# Patient Record
Sex: Female | Born: 1975 | Race: White | Hispanic: No | Marital: Single | Smoking: Former smoker
Health system: Southern US, Community
[De-identification: ages and names within clinical notes are randomized; demographics above are authoritative.]

## PROBLEM LIST (undated history)

## (undated) DIAGNOSIS — R7303 Prediabetes: Secondary | ICD-10-CM

## (undated) DIAGNOSIS — C801 Malignant (primary) neoplasm, unspecified: Secondary | ICD-10-CM

## (undated) DIAGNOSIS — E782 Mixed hyperlipidemia: Secondary | ICD-10-CM

## (undated) DIAGNOSIS — F988 Other specified behavioral and emotional disorders with onset usually occurring in childhood and adolescence: Secondary | ICD-10-CM

## (undated) DIAGNOSIS — Z803 Family history of malignant neoplasm of breast: Secondary | ICD-10-CM

## (undated) DIAGNOSIS — Z801 Family history of malignant neoplasm of trachea, bronchus and lung: Secondary | ICD-10-CM

## (undated) DIAGNOSIS — Z8049 Family history of malignant neoplasm of other genital organs: Secondary | ICD-10-CM

## (undated) DIAGNOSIS — I10 Essential (primary) hypertension: Secondary | ICD-10-CM

## (undated) HISTORY — PX: HAND SURGERY: SHX662

## (undated) HISTORY — DX: Family history of malignant neoplasm of breast: Z80.3

## (undated) HISTORY — DX: Family history of malignant neoplasm of other genital organs: Z80.49

## (undated) HISTORY — DX: Family history of malignant neoplasm of trachea, bronchus and lung: Z80.1

## (undated) HISTORY — PX: TUMOR REMOVAL: SHX12

## (undated) HISTORY — PX: BREAST SURGERY: SHX581

## (undated) HISTORY — PX: ENDOMETRIAL ABLATION: SHX621

---

## 2019-04-16 ENCOUNTER — Other Ambulatory Visit: Payer: Self-pay | Admitting: Physician Assistant

## 2019-04-16 ENCOUNTER — Ambulatory Visit
Admission: RE | Admit: 2019-04-16 | Discharge: 2019-04-16 | Disposition: A | Discharge status: Home / Self Care | Payer: PRIVATE HEALTH INSURANCE | Source: Ambulatory Visit | Attending: Physician Assistant | Admitting: Physician Assistant

## 2019-04-16 DIAGNOSIS — R2232 Localized swelling, mass and lump, left upper limb: Secondary | ICD-10-CM

## 2019-04-16 IMAGING — CR DG HAND 2V*L*
2 series · 2 of 2 positions shown · non-contrast
Comparison: None.

CLINICAL DATA: Hard nodule 5th finger left hand marked with BB, no
injury

EXAM:
LEFT HAND - 2 VIEW

[x hand pa left]
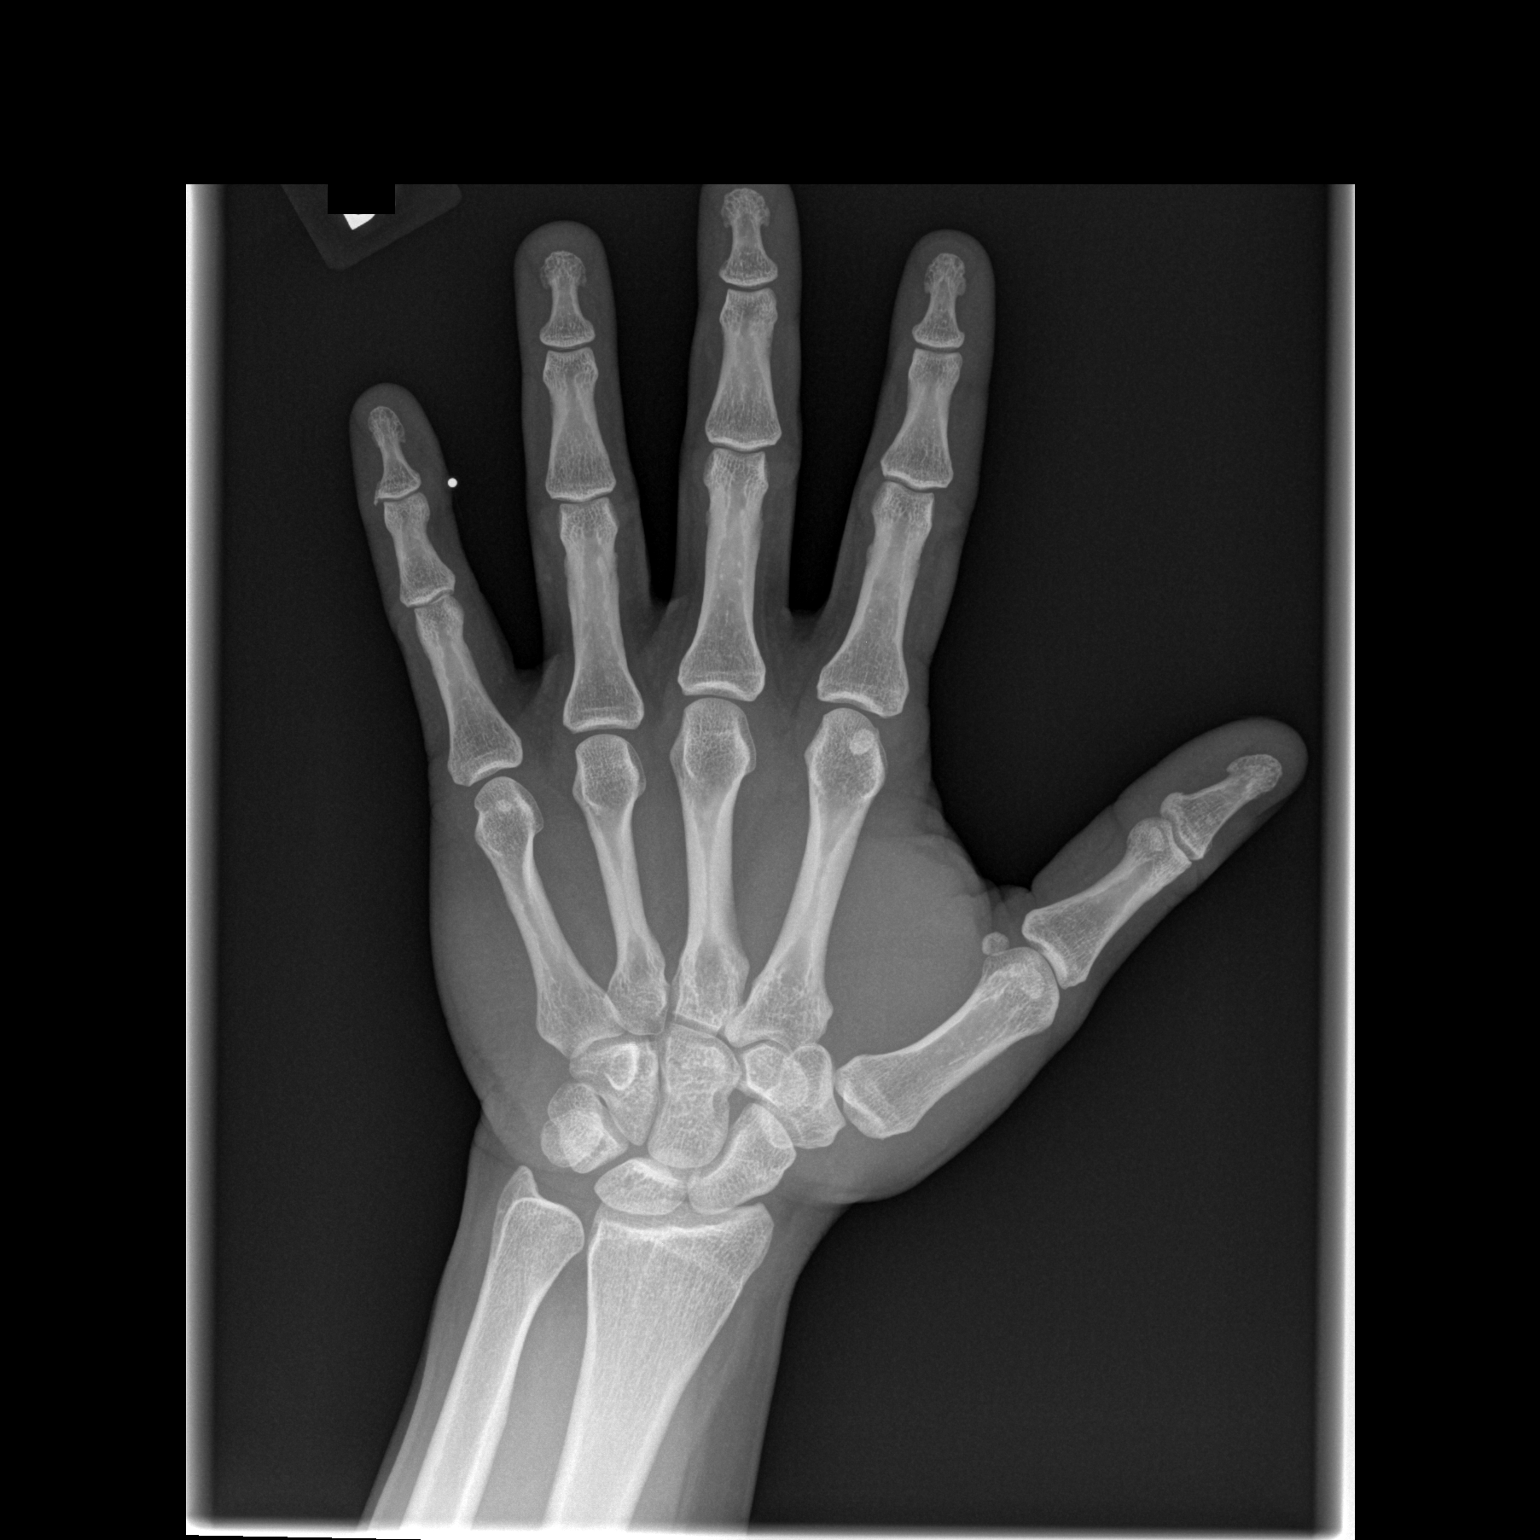

[x hand lat left]
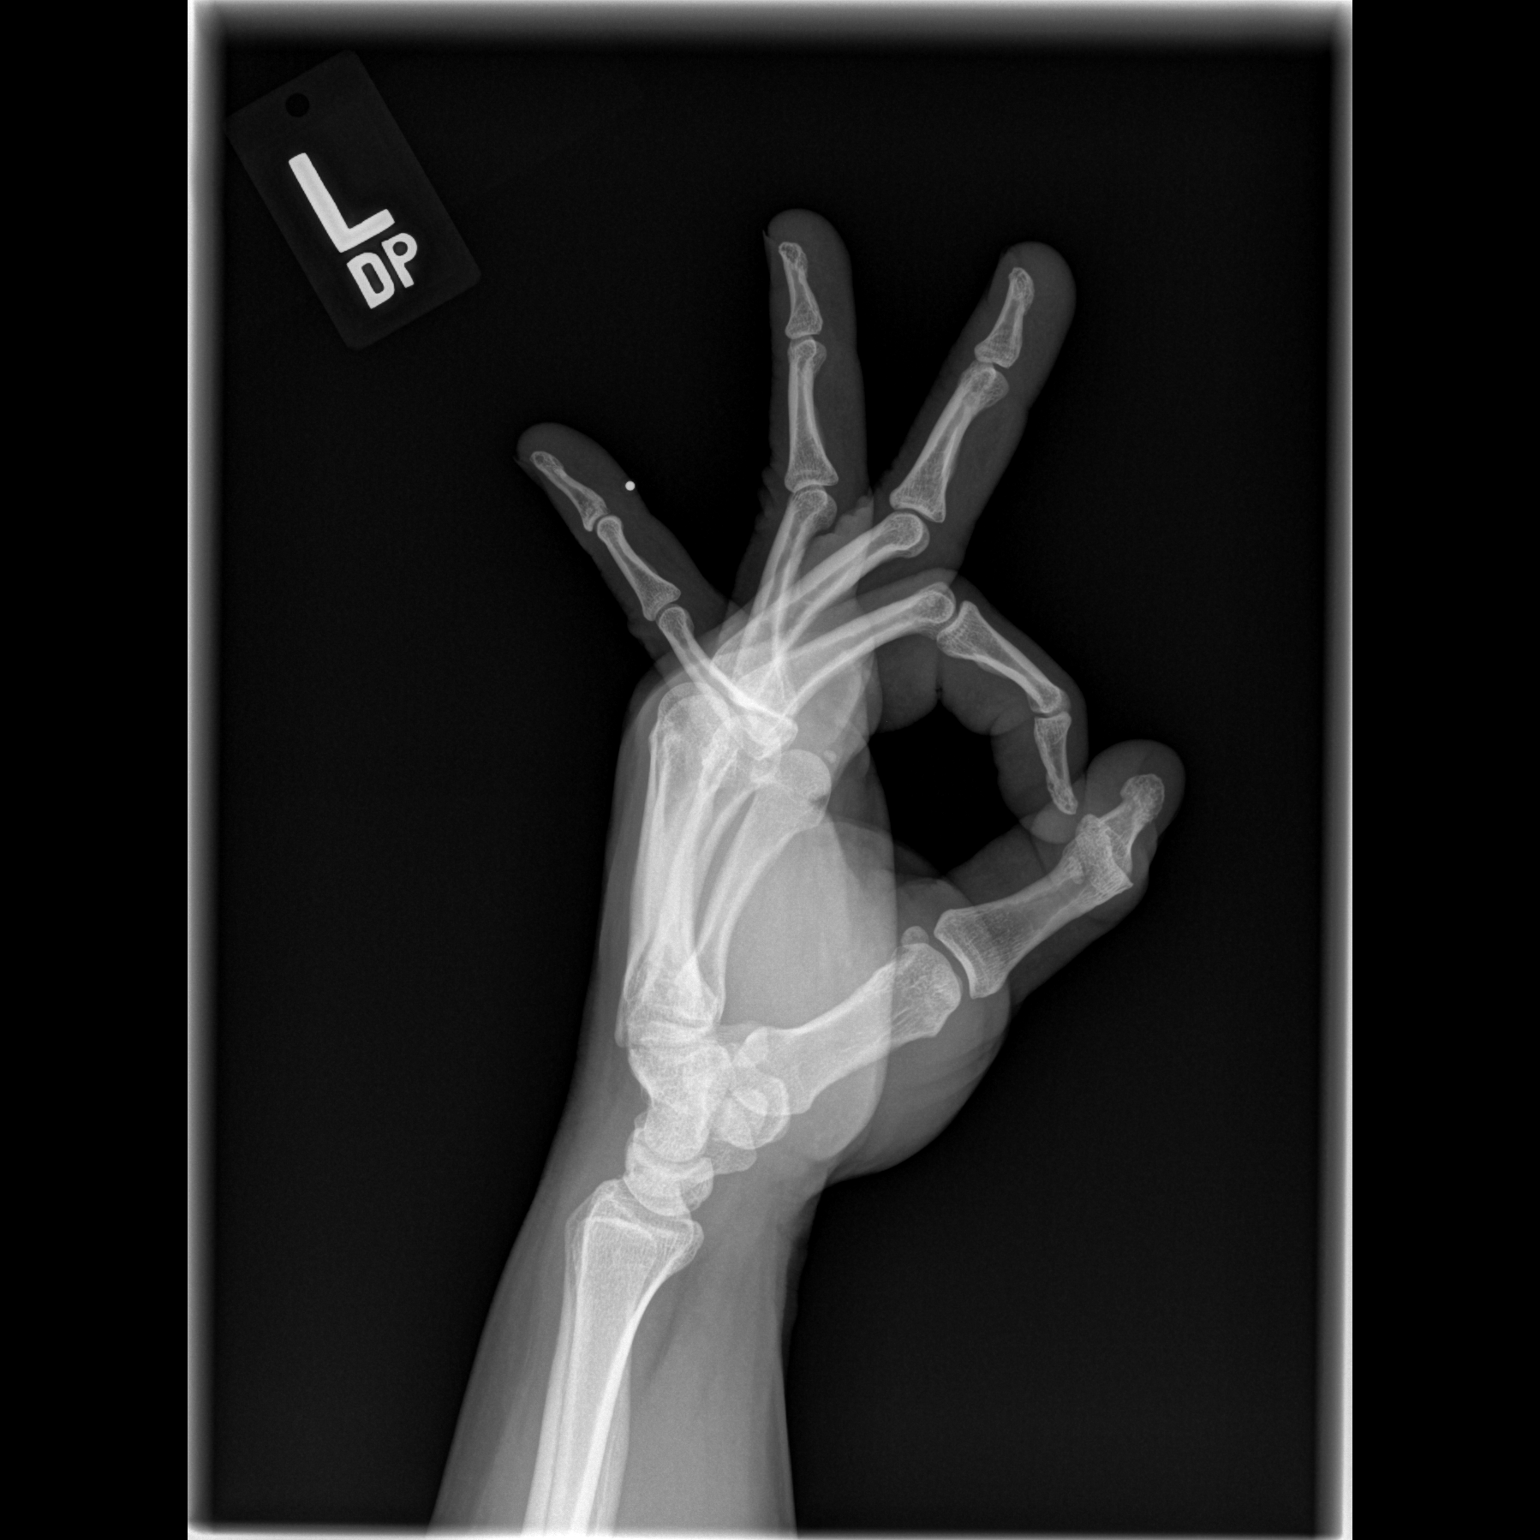

[2 of 2 positions shown; findings below may reference images not displayed]

FINDINGS: There is no evidence of fracture or dislocation. At the site of
concern in the left fifth finger distally marked by a BB there is no
focal osseous lesion. No significant degenerative changes. No
discrete soft tissue abnormality is noted.
IMPRESSION: No radiographic abnormality noted in the bone or soft tissues at the
site of concern in the distal left fifth finger.

## 2019-05-09 ENCOUNTER — Other Ambulatory Visit: Payer: Self-pay | Admitting: Radiology

## 2019-05-21 ENCOUNTER — Ambulatory Visit: Payer: Self-pay | Admitting: Surgery

## 2019-05-21 DIAGNOSIS — N6489 Other specified disorders of breast: Secondary | ICD-10-CM

## 2019-05-21 NOTE — H&P (View-Only) (Signed)
Christina Baldwin Documented: 05/21/2019 11:08 AM Location: [facility] Patient #: [id number] DOB: 1976/01/10 Single / Language: [name] / Race: White Female  History of Present Illness Christina Moores A. Khayman Kirsch MD; 05/21/2019 12:04 PM) Patient words: Patient sent at the request of Dr. Luan Pulling of [facility] center for abnormal right mammogram. This was her first mammogram. There are 2 areas in the right breast upper quadrant. Core biopsy showed radial scar AND PASH. She has 2 first-degree relatives with breast cancer on her mother's side. Patient denies any history of breast pain, nipple discharge or breast mass bilaterally.  The patient is a 43 year old female.   Past Surgical History Sharyn Lull R. Brooks, CMA; 05/21/2019 11:08 AM) Breast Biopsy Right.  Diagnostic Studies History Sharyn Lull R. Brooks, CMA; 05/21/2019 11:08 AM) Colonoscopy 5-10 years ago Mammogram within last year Pap Smear 1-5 years ago  Allergies Sharyn Lull R. Brooks, CMA; 05/21/2019 11:09 AM) Bactrim *ANTI-INFECTIVE AGENTS - MISC.*  Medication History Sharyn Lull R. Brooks, CMA; 05/21/2019 11:10 AM) Rosuvastatin Calcium (20MG  Tablet, Oral) Active. cloNIDine HCl (0.1MG  Tablet, Oral) Active. Fenofibrate (160MG  Tablet, Oral) Active. Lisinopril (10MG  Tablet, Oral) Active. SUMAtriptan Succinate (50MG  Tablet, Oral) Active. Methocarbamol (750MG  Tablet, Oral) Active. traZODone HCl (50MG  Tablet, Oral) Active. metFORMIN HCl (500MG  Tablet, Oral) Active. Adderall (20MG  Tablet, Oral) Active. Medications Reconciled  Social History Sharyn Lull R. Brooks, CMA; 05/21/2019 11:08 AM) Alcohol use Moderate alcohol use. Caffeine use Coffee, Tea. No drug use Tobacco use Former smoker.  Family History Sharyn Lull R. Rolena Infante, CMA; 05/21/2019 11:08 AM) Alcohol Abuse Brother, Father. Arthritis Brother, Family Members In General, Mother. Breast Cancer Family Members In General. Cancer Family Members In  General. Colon Polyps Family Members In General. Depression Family Members In General, Mother. Diabetes Mellitus Family Members In General. Heart Disease Family Members In General. Heart disease in female family member before age 40 Hypertension Brother, Father, Mother. Melanoma Family Members In General. Migraine Headache Brother, Mother, Son. Respiratory Condition Family Members In General. Seizure disorder Family Members In General. Thyroid problems Family Members In General.  Pregnancy / Birth History Sharyn Lull R. Rolena Infante, CMA; 05/21/2019 11:08 AM) Age at menarche 89 years. Gravida 1 Irregular periods Maternal age 67-25 Para 1  Other Problems Sharyn Lull R. Brooks, CMA; 05/21/2019 11:08 AM) Chronic Renal Failure Syndrome Gastric Ulcer Gastroesophageal Reflux Disease Hemorrhoids High blood pressure Hypercholesterolemia Lump In Breast Migraine Headache Seizure Disorder Transfusion history     Review of Systems [facility] R. Brooks CMA; 05/21/2019 11:08 AM) General Not Present- Appetite Loss, Chills, Fatigue, Fever, Night Sweats, Weight Gain and Weight Loss. Skin Not Present- Change in Wart/Mole, Dryness, Hives, Jaundice, New Lesions, Non-Healing Wounds, Rash and Ulcer. HEENT Present- Nose Bleed and Sinus Pain. Not Present- Earache, Hearing Loss, Hoarseness, Oral Ulcers, Ringing in the Ears, Seasonal Allergies, Sore Throat, Visual Disturbances, Wears glasses/contact lenses and Yellow Eyes. Respiratory Present- Snoring. Not Present- Bloody sputum, Chronic Cough, Difficulty Breathing and Wheezing. Breast Present- Breast Mass. Not Present- Breast Pain, Nipple Discharge and Skin Changes. Cardiovascular Not Present- Chest Pain, Difficulty Breathing Lying Down, Leg Cramps, Palpitations, Rapid Heart Rate, Shortness of Breath and Swelling of Extremities. Gastrointestinal Not Present- Abdominal Pain, Bloating, Bloody Stool, Change in Bowel Habits, Chronic  diarrhea, Constipation, Difficulty Swallowing, Excessive gas, Gets full quickly at meals, Hemorrhoids, Indigestion, Nausea, Rectal Pain and Vomiting. Female Genitourinary Not Present- Frequency, Nocturia, Painful Urination, Pelvic Pain and Urgency. Musculoskeletal Not Present- Back Pain, Joint Pain, Joint Stiffness, Muscle Pain, Muscle Weakness and Swelling of Extremities. Neurological Present- Headaches. Not Present- Decreased Memory, Fainting,  Numbness, Seizures, Tingling, Tremor, Trouble walking and Weakness. Psychiatric Not Present- Anxiety, Bipolar, Change in Sleep Pattern, Depression, Fearful and Frequent crying. Endocrine Not Present- Cold Intolerance, Excessive Hunger, Hair Changes, Heat Intolerance, Hot flashes and New Diabetes. Hematology Not Present- Blood Thinners, Easy Bruising, Excessive bleeding, Gland problems, HIV and Persistent Infections.  Vitals [employer] R. Brooks CMA; 05/21/2019 11:08 AM) 05/21/2019 11:08 AM Weight: 203.13 lb Height: 64in Body Surface Area: 1.97 m Body Mass Index: 34.87 kg/m  Pulse: 114 (Regular)  BP: 124/80 (Sitting, Left Arm, Standard)        Physical Exam (Aarron Wierzbicki A. Gisele Pack MD; 05/21/2019 12:05 PM)  General Mental Status-Alert. General Appearance-Consistent with stated age. Hydration-Well hydrated. Voice-Normal.  Chest and Lung Exam Note: no sob no wheezing  Breast Note: Mild right breast bruising noted. No Hematoma or mass. Left breast is normal.  Cardiovascular Note: NSR  Neurologic Neurologic evaluation reveals -alert and oriented x 3 with no impairment of recent or remote memory. Mental Status-Normal.  Musculoskeletal Normal Exam - Left-Upper Extremity Strength Normal and Lower Extremity Strength Normal. Normal Exam - Right-Upper Extremity Strength Normal and Lower Extremity Strength Normal.  Lymphatic Head & Neck  General Head & Neck Lymphatics: Bilateral - Description -  Normal. Axillary  General Axillary Region: Bilateral - Description - Normal. Tenderness - Non Tender.    Assessment & Plan (Enjoli Tidd A. 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 She desires excision of the second area since she has 2 clips in place and to remove it such that it does not show up on future mammograms. Discussed the pros and cons of this approach and potential cosmetic implications which I think will be minimal in her case. Discussed observation of the benign site as well but she would like it removed at the same setting. I discussed the use of 2 seeds as well. Risk of lumpectomy include bleeding, infection, seroma, more surgery, use of seed/wire, wound care, cosmetic deformity and the need for other treatments, death , blood clots, death. Pt agrees to proceed.  Current Plans Pt Education - [facility] Free Text Education/Instructions: discussed with patient and provided information. You are being scheduled for surgery- Our schedulers will call you.  You should hear from our office's scheduling department within 5 working days about the location, date, and time of surgery. We try to make accommodations for patient's preferences in scheduling surgery, but sometimes the OR schedule or the surgeon's schedule prevents [place] from making those accommodations.  If you have not heard from our office [phone] in 5 working days, call the office and ask for your surgeon's nurse.  If you have other questions about your diagnosis, plan, or surgery, call the office and ask for your surgeon's nurse.  Pt Education - [facility] Breast Biopsy HCI: discussed with patient and provided information.

## 2019-05-21 NOTE — H&P (Signed)
Christina Baldwin Documented: 05/21/2019 11:08 AM Location: [facility] Patient #: [id number] DOB: 1976-03-18 Single / Language: Christina Baldwin / Race: White Female  History of Present Illness Christina Moores A. Tisha Cline MD; 05/21/2019 12:04 PM) Patient words: Patient sent at the request of [facility]an [facility] for abnormal right mammogram. This was her first mammogram. There are 2 areas in the right breast upper quadrant. Core biopsy showed radial scar AND PASH. She has 2 first-degree relatives with breast cancer on her mother's side. Patient denies any history of breast pain, nipple discharge or breast mass bilaterally.  The patient is a 43 year old female.   Past Surgical History Christina Baldwin, Baldwin; 05/21/2019 11:08 AM) Breast Biopsy Right.  Diagnostic Studies History Christina Baldwin, Baldwin; 05/21/2019 11:08 AM) Colonoscopy 5-10 years ago Mammogram within last year Pap Smear 1-5 years ago  Allergies Christina Baldwin, Baldwin; 05/21/2019 11:09 AM) Bactrim *ANTI-INFECTIVE AGENTS - MISC.*  Medication History Christina Baldwin, Baldwin; 05/21/2019 11:10 AM) Rosuvastatin Calcium (20MG  Tablet, Oral) Active. cloNIDine HCl (0.1MG  Tablet, Oral) Active. Fenofibrate (160MG  Tablet, Oral) Active. Lisinopril (10MG  Tablet, Oral) Active. SUMAtriptan Succinate (50MG  Tablet, Oral) Active. Methocarbamol (750MG  Tablet, Oral) Active. traZODone HCl (50MG  Tablet, Oral) Active. metFORMIN HCl (500MG  Tablet, Oral) Active. Adderall (20MG  Tablet, Oral) Active. Medications Reconciled  Social History Christina Baldwin, Baldwin; 05/21/2019 11:08 AM) Alcohol use Moderate alcohol use. Caffeine use Coffee, Tea. No drug use Tobacco use Former smoker.  Family History Christina Baldwin, Baldwin; 05/21/2019 11:08 AM) Alcohol Abuse Brother, Father. Arthritis Brother, Family Members In General, Mother. Breast Cancer Family Members In General. Cancer Family Members In  General. Colon Polyps Family Members In General. Depression Family Members In General, Mother. Diabetes Mellitus Family Members In General. Heart Disease Family Members In General. Heart disease in female family member before age 50 Hypertension Brother, Father, Mother. Melanoma Family Members In General. Migraine Headache Brother, Mother, Son. Respiratory Condition Family Members In General. Seizure disorder Family Members In General. Thyroid problems Family Members In General.  Pregnancy / Birth History Christina Baldwin, Baldwin; 05/21/2019 11:08 AM) Age at menarche 57 years. Gravida 1 Irregular periods Maternal age 17-25 Para 1  Other Problems Christina Baldwin, Baldwin; 05/21/2019 11:08 AM) Chronic Renal Failure Syndrome Gastric Ulcer Gastroesophageal Reflux Disease Hemorrhoids High blood pressure Hypercholesterolemia Lump In Breast Migraine Headache Seizure Disorder Transfusion history     Review of Systems Christina Baldwin; 05/21/2019 11:08 AM) General Not Present- Appetite Loss, Chills, Fatigue, Fever, Night Sweats, Weight Gain and Weight Loss. Skin Not Present- Change in Wart/Mole, Dryness, Hives, Jaundice, New Lesions, Non-Healing Wounds, Rash and Ulcer. HEENT Present- Nose Bleed and Sinus Pain. Not Present- Earache, Hearing Loss, Hoarseness, Oral Ulcers, Ringing in the Ears, Seasonal Allergies, Sore Throat, Visual Disturbances, Wears glasses/contact lenses and Yellow Eyes. Respiratory Present- Snoring. Not Present- Bloody sputum, Chronic Cough, Difficulty Breathing and Wheezing. Breast Present- Breast Mass. Not Present- Breast Pain, Nipple Discharge and Skin Changes. Cardiovascular Not Present- Chest Pain, Difficulty Breathing Lying Down, Leg Cramps, Palpitations, Rapid Heart Rate, Shortness of Breath and Swelling of Extremities. Gastrointestinal Not Present- Abdominal Pain, Bloating, Bloody Stool, Change in Bowel Habits, Chronic  diarrhea, Constipation, Difficulty Swallowing, Excessive gas, Gets full quickly at meals, Hemorrhoids, Indigestion, Nausea, Rectal Pain and Vomiting. Female Genitourinary Not Present- Frequency, Nocturia, Painful Urination, Pelvic Pain and Urgency. Musculoskeletal Not Present- Back Pain, Joint Pain, Joint Stiffness, Muscle Pain, Muscle Weakness and Swelling of Extremities. Neurological Present- Headaches. Not Present- Decreased Memory, Fainting,  Numbness, Seizures, Tingling, Tremor, Trouble walking and Weakness. Psychiatric Not Present- Anxiety, Bipolar, Change in Sleep Pattern, Depression, Fearful and Frequent crying. Endocrine Not Present- Cold Intolerance, Excessive Hunger, Hair Changes, Heat Intolerance, Hot flashes and New Diabetes. Hematology Not Present- Blood Thinners, Easy Bruising, Excessive bleeding, Gland problems, HIV and Persistent Infections.  Vitals [employer] R. Baldwin Baldwin; 05/21/2019 11:08 AM) 05/21/2019 11:08 AM Weight: 203.13 lb Height: 64in Body Surface Area: 1.97 m Body Mass Index: 34.87 kg/m  Pulse: 114 (Regular)  BP: 124/80 (Sitting, Left Arm, Standard)        Physical Exam (Christina Riano A. Arushi Partridge MD; 05/21/2019 12:05 PM)  General Mental Status-Alert. General Appearance-Consistent with stated age. Hydration-Well hydrated. Voice-Normal.  Chest and Lung Exam Note: no sob no wheezing  Breast Note: Mild right breast bruising noted. No Hematoma or mass. Left breast is normal.  Cardiovascular Note: NSR  Neurologic Neurologic evaluation reveals -alert and oriented x 3 with no impairment of recent or remote memory. Mental Status-Normal.  Musculoskeletal Normal Exam - Left-Upper Extremity Strength Normal and Lower Extremity Strength Normal. Normal Exam - Right-Upper Extremity Strength Normal and Lower Extremity Strength Normal.  Lymphatic Head & Neck  General Head & Neck Lymphatics: Bilateral - Description -  Normal. Axillary  General Axillary Region: Bilateral - Description - Normal. Tenderness - Non Tender.    Assessment & Plan (Christina Mehan A. Miri Jose MD; 05/21/2019 12:06 PM)  RADIAL SCAR OF RIGHT BREAST (N64.89) Impression: Patient desires right breast lumpectomy due to small potential risk of occult malignancy. She desires excision of the second area since she has 2 clips in place and to remove it such that it does not show up on future mammograms. Discussed the pros and cons of this approach and potential cosmetic implications which I think will be minimal in her case. Discussed observation of the benign site as well but she would like it removed at the same setting. I discussed the use of 2 seeds as well. Risk of lumpectomy include bleeding, infection, seroma, more surgery, use of seed/wire, wound care, cosmetic deformity and the need for other treatments, death , blood clots, death. Pt agrees to proceed.  Current Plans Pt Education - [facility] Free Text Education/Instructions: discussed with patient and provided information. You are being scheduled for surgery- Our schedulers will call you.  You should hear from our office's scheduling department within 5 working days about the location, date, and time of surgery. We try to make accommodations for patient's preferences in scheduling surgery, but sometimes the OR schedule or the surgeon's schedule prevents [place] from making those accommodations.  If you have not heard from our office [phone] in 5 working days, call the office and ask for your surgeon's nurse.  If you have other questions about your diagnosis, plan, or surgery, call the office and ask for your surgeon's nurse.  Pt Education - [facility] Breast Biopsy HCI: discussed with patient and provided information.

## 2019-05-24 ENCOUNTER — Encounter: Payer: Self-pay | Admitting: Licensed Clinical Social Worker

## 2019-05-24 ENCOUNTER — Telehealth: Payer: Self-pay | Admitting: Licensed Clinical Social Worker

## 2019-05-24 NOTE — Telephone Encounter (Signed)
A genetic counseling appt has been scheduled for Ms. Christina Baldwin to see Faith Rogue on 11/12 at 9am. Pt aware to arrive 15 minutes early.

## 2019-05-30 ENCOUNTER — Other Ambulatory Visit: Payer: Self-pay | Admitting: Licensed Clinical Social Worker

## 2019-05-30 ENCOUNTER — Telehealth: Payer: Self-pay | Admitting: Licensed Clinical Social Worker

## 2019-05-30 DIAGNOSIS — Z803 Family history of malignant neoplasm of breast: Secondary | ICD-10-CM

## 2019-05-30 NOTE — Telephone Encounter (Signed)
Called patient regarding upcoming Webex appointment, this will be a walk-in visit. Set up a webex invite just in case, e-mail has been sent. Voicemail was left.

## 2019-05-31 ENCOUNTER — Inpatient Hospital Stay: Payer: PRIVATE HEALTH INSURANCE

## 2019-05-31 ENCOUNTER — Other Ambulatory Visit: Payer: Self-pay

## 2019-05-31 ENCOUNTER — Encounter: Payer: Self-pay | Admitting: Licensed Clinical Social Worker

## 2019-05-31 ENCOUNTER — Inpatient Hospital Stay: Payer: PRIVATE HEALTH INSURANCE | Attending: Genetic Counselor | Admitting: Licensed Clinical Social Worker

## 2019-05-31 DIAGNOSIS — Z8049 Family history of malignant neoplasm of other genital organs: Secondary | ICD-10-CM | POA: Diagnosis not present

## 2019-05-31 DIAGNOSIS — Z1379 Encounter for other screening for genetic and chromosomal anomalies: Secondary | ICD-10-CM

## 2019-05-31 DIAGNOSIS — Z803 Family history of malignant neoplasm of breast: Secondary | ICD-10-CM | POA: Diagnosis not present

## 2019-05-31 DIAGNOSIS — Z801 Family history of malignant neoplasm of trachea, bronchus and lung: Secondary | ICD-10-CM | POA: Diagnosis not present

## 2019-05-31 LAB — GENETIC SCREENING ORDER

## 2019-05-31 NOTE — Progress Notes (Signed)
REFERRING PROVIDER: Erroll Luna, MD [address] [place],  [place] [address]  PRIMARY PROVIDER:  Sue Lush, PA-C  PRIMARY REASON FOR VISIT:  1. Family history of breast cancer   2. Family history of lung cancer   3. Family history of cervical cancer      HISTORY OF PRESENT ILLNESS:   Christina Baldwin, a 43 y.o. female, was seen for a [facility] cancer genetics consultation at the request of Dr. Brantley Stage due to family history of breast cancer.  Christina Baldwin presents to clinic today to discuss the possibility of a hereditary predisposition to cancer, genetic testing, and to further clarify her future cancer risks, as well as potential cancer risks for family members.    Christina Baldwin is a 43 y.o. female with no personal history of cancer.    CANCER HISTORY:  Oncology History   No history exists.     RISK FACTORS:  Menarche was at age 53.  First live birth at age 70  .  OCP use for approximately 0 years.  Ovaries intact: yes.  Hysterectomy: no.  Menopausal status: premenopausal.  HRT use: 0 years. Colonoscopy: yes; normal. Mammogram within the last year: yes. Number of breast biopsies: 2.- on 05/09/2019, 1 showed complex sclerosing lesion, the other showed PASH, lumpectomy scheduled 06/20/2019 Up to date with pelvic exams: yes. Any excessive radiation exposure in the past: no  Past Medical History:  Diagnosis Date  . Family history of breast cancer   . Family history of cervical cancer   . Family history of lung cancer    Social History   Socioeconomic History  . Marital status: Single    Spouse name: Not on file  . Number of children: Not on file  . Years of education: Not on file  . Highest education level: Not on file  Occupational History  . Not on file  Social Needs  . Financial resource strain: Not on file  . Food insecurity    Worry: Not on file    Inability: Not on file  . Transportation needs    Medical: Not on file    Non-medical: Not  on file  Tobacco Use  . Smoking status: Not on file  Substance and Sexual Activity  . Alcohol use: Not on file  . Drug use: Not on file  . Sexual activity: Not on file  Lifestyle  . Physical activity    Days per week: Not on file    Minutes per session: Not on file  . Stress: Not on file  Relationships  . Social Herbalist on phone: Not on file    Gets together: Not on file    Attends religious service: Not on file    Active member of club or organization: Not on file    Attends meetings of clubs or organizations: Not on file    Relationship status: Not on file  Other Topics Concern  . Not on file  Social History Narrative  . Not on file     FAMILY HISTORY:  We obtained a detailed, 4-generation family history.  Significant diagnoses are listed below: Family History  Problem Relation Age of Onset  . Breast cancer Maternal Aunt        dx early 2s  . Leukemia Maternal Grandfather   . Lung cancer Paternal Grandmother   . Breast cancer Other   . Cervical cancer Cousin   . Lung cancer Cousin   . Breast  cancer Maternal Great-grandmother   . Breast cancer Cousin        dx 66s   Christina Baldwin has 1 son, 42, no history of cancer. She has 1 brother, 77, no history of cancer and had 1 sister who passed at 61 days old.   Christina Baldwin's mother is living at 68 and has had lumps removed from her breast ,but no breast cancer. Patient has 1 maternal aunt, 1 maternal uncle. Her aunt had breast cancer in her early 29s and is living in her 8s. Her uncle had 3 daughters and 2 had cervical cancer, and the other had lung cancer. Patient's maternal grandfather had leukemia and died at 28. Maternal grandmother died at 32, no cancer. This grandmother's sister, patient's great aunt, had breast cancer, and her daughter (patient's first cousin once removed) had breast cancer in her 68s. Additionally, patient's grandmother's mother had breast cancer.  Christina Baldwin's father is living at 13.  She has limited information about his side of the family. Unaware of paternal aunts/uncles/cousins. Her paternal grandmother is living at 77 and had lung cancer in her 33s. Paternal grandfather died by suicide.   Christina Baldwin is unaware of previous family history of genetic testing for hereditary cancer risks. Patient's maternal ancestors are of Irish/British descent, and paternal ancestors are of [place] descent. There is no reported Ashkenazi Jewish ancestry. There is no known consanguinity.  GENETIC COUNSELING ASSESSMENT: [name] is a 43 y.o. female with a family history which is somewhat suggestive of a hereditary cancer syndrome and predisposition to cancer. We, therefore, discussed and recommended the following at today's visit.   DISCUSSION: We discussed that 5 - 10% of breast cancer is hereditary, with most cases associated with BRCA1/BRCA2 mutations.  There are other genes that can be associated with hereditary breast cancer syndromes. There are other genes associated with other cancers not seen in her family as well. We discussed that testing is beneficial for several reasons including knowing how to follow individuals for cancer screenings, and understand if other family members could be at risk for cancer and allow them to undergo genetic testing.   We reviewed the characteristics, features and inheritance patterns of hereditary cancer syndromes. We also discussed genetic testing, including the appropriate family members to test, the process of testing, insurance coverage and turn-around-time for results. We discussed the implications of a negative, positive and/or variant of uncertain significant result. We recommended [name] pursue genetic testing for the [facility] CancerNext-Expanded gene panel.   The CancerNext-Expanded + RNAinsight gene panel offered by [employer] and includes sequencing and rearrangement analysis for the following 77 genes: IP, ALK, APC*, ATM*, AXIN2, BAP1,  BARD1, BLM, BMPR1A, BRCA1*, BRCA2*, BRIP1*, CDC73, CDH1*,CDK4, CDKN1B, CDKN2A, CHEK2*, CTNNA1, DICER1, FANCC, FH, FLCN, GALNT12, KIF1B, LZTR1, MAX, MEN1, MET, MLH1*, MSH2*, MSH3, MSH6*, MUTYH*, NBN, NF1*, NF2, NTHL1, PALB2*, PHOX2B, PMS2*, POT1, PRKAR1A, PTCH1, PTEN*, RAD51C*, RAD51D*,RB1, RECQL, RET, SDHA, SDHAF2, SDHB, SDHC, SDHD, SMAD4, SMARCA4, SMARCB1, SMARCE1, STK11, SUFU, TMEM127, TP53*,TSC1, TSC2, VHL and XRCC2 (sequencing and deletion/duplication); EGFR, EGLN1, HOXB13, KIT, MITF, PDGFRA, POLD1 and POLE (sequencing only); EPCAM and GREM1 (deletion/duplication only). DNA and RNA analyses performed for * genes.  Based on Ms. Riebel's family history of cancer, she meets medical criteria for genetic testing. Despite that she meets criteria, she may still have an out of pocket cost. We discussed that if her out of pocket cost for testing is over $100, the laboratory will call and confirm whether she wants to proceed with  testing.  If the out of pocket cost of testing is less than $100 she will be billed by the genetic testing laboratory.   We discussed that some people do not want to undergo genetic testing due to fear of genetic discrimination.  A federal law called the Genetic Information Non-Discrimination Act (GINA) of 2008 helps protect individuals against genetic discrimination based on their genetic test results.  It impacts both health insurance and employment.  For health insurance, it protects against increased premiums, being kicked off insurance or being forced to take a test in order to be insured.  For employment it protects against hiring, firing and promoting decisions based on genetic test results.  Health status due to a cancer diagnosis is not protected under GINA.  This law does not protect life insurance, disability insurance, or other types of insurance.   Based on the patient's personal and family history, a statistical model [employer]) was used to estimate her risk of  developing breast cancer. This estimates her lifetime risk of developing breast cancer to be approximately 24.8%. This estimation is in the setting of negative genetic test results and may change.  The patient's lifetime breast cancer risk is a preliminary estimate based on available information using one of several models endorsed by the [place] (ACS). The ACS recommends consideration of breast MRI screening as an adjunct to mammography for patients at high risk (defined as 20% or greater lifetime risk).    Ms. Belanger has been determined to be at high risk for breast cancer.  Therefore, we recommend that annual screening with mammography and breast MRI be performed. This will be discussed further at results return.  PLAN: After considering the risks, benefits, and limitations, Ms. Beckworth provided informed consent to pursue genetic testing and the blood sample was sent to [facility] for analysis of the CancerNext-Expanded+RNAinsight panel. Results should be available within approximately 2-3 weeks' time, at which point they will be disclosed by telephone to Christina Baldwin, as will any additional recommendations warranted by these results. Ms. Diclemente will receive a summary of her genetic counseling visit and a copy of her results once available. This information will also be available in Epic.   Based on Ms. Jachim's family history, we recommended her maternal relatives have genetic counseling and testing. Ms. Alperin will let us know if we can be of any assistance in coordinating genetic counseling and/or testing for these family members.  Ms. Sommerville's questions were answered to her satisfaction today. Our contact information was provided should additional questions or concerns arise. Thank you for the referral and allowing [place] to share in the care of your patient.   Faith Rogue, MS, [facility] Genetic Counselor [email or web address] Phone: [phone]  The  patient was seen for a total of 30 minutes in face-to-face genetic counseling.  Drs. Magrinat, Lindi Adie and/or Burr Medico were available for discussion regarding this case.   _______________________________________________________________________ For Office Staff:  Number of people involved in session: 1 Was an Intern/ student involved with case: no

## 2019-06-11 ENCOUNTER — Other Ambulatory Visit: Payer: Self-pay

## 2019-06-11 ENCOUNTER — Encounter (HOSPITAL_BASED_OUTPATIENT_CLINIC_OR_DEPARTMENT_OTHER): Payer: Self-pay | Admitting: *Deleted

## 2019-06-12 ENCOUNTER — Encounter (HOSPITAL_BASED_OUTPATIENT_CLINIC_OR_DEPARTMENT_OTHER)
Admission: RE | Admit: 2019-06-12 | Discharge: 2019-06-12 | Disposition: A | Discharge status: Home / Self Care | Payer: PRIVATE HEALTH INSURANCE | Source: Ambulatory Visit | Attending: Surgery | Admitting: Surgery

## 2019-06-12 DIAGNOSIS — I10 Essential (primary) hypertension: Secondary | ICD-10-CM | POA: Diagnosis not present

## 2019-06-12 DIAGNOSIS — Z7984 Long term (current) use of oral hypoglycemic drugs: Secondary | ICD-10-CM | POA: Diagnosis not present

## 2019-06-12 DIAGNOSIS — E7800 Pure hypercholesterolemia, unspecified: Secondary | ICD-10-CM | POA: Diagnosis not present

## 2019-06-12 DIAGNOSIS — N6489 Other specified disorders of breast: Secondary | ICD-10-CM | POA: Diagnosis not present

## 2019-06-12 DIAGNOSIS — Z79899 Other long term (current) drug therapy: Secondary | ICD-10-CM | POA: Diagnosis not present

## 2019-06-12 DIAGNOSIS — G43909 Migraine, unspecified, not intractable, without status migrainosus: Secondary | ICD-10-CM | POA: Diagnosis not present

## 2019-06-12 DIAGNOSIS — Z6838 Body mass index (BMI) 38.0-38.9, adult: Secondary | ICD-10-CM | POA: Diagnosis not present

## 2019-06-12 DIAGNOSIS — Z01818 Encounter for other preprocedural examination: Secondary | ICD-10-CM | POA: Insufficient documentation

## 2019-06-12 DIAGNOSIS — Z87891 Personal history of nicotine dependence: Secondary | ICD-10-CM | POA: Diagnosis not present

## 2019-06-12 LAB — CBC WITH DIFFERENTIAL/PLATELET
Abs Immature Granulocytes: 0.03 10*3/uL (ref 0.00–0.07)
Basophils Absolute: 0 10*3/uL (ref 0.0–0.1)
Basophils Relative: 0 %
Eosinophils Absolute: 0.2 10*3/uL (ref 0.0–0.5)
Eosinophils Relative: 2 %
HCT: 37.4 % (ref 36.0–46.0)
Hemoglobin: 12 g/dL (ref 12.0–15.0)
Immature Granulocytes: 0 %
Lymphocytes Relative: 22 %
Lymphs Abs: 2.1 10*3/uL (ref 0.7–4.0)
MCH: 30 pg (ref 26.0–34.0)
MCHC: 32.1 g/dL (ref 30.0–36.0)
MCV: 93.5 fL (ref 80.0–100.0)
Monocytes Absolute: 0.5 10*3/uL (ref 0.1–1.0)
Monocytes Relative: 5 %
Neutro Abs: 6.6 10*3/uL (ref 1.7–7.7)
Neutrophils Relative %: 71 %
Platelets: 400 10*3/uL (ref 150–400)
RBC: 4 MIL/uL (ref 3.87–5.11)
RDW: 12.3 % (ref 11.5–15.5)
WBC: 9.5 10*3/uL (ref 4.0–10.5)
nRBC: 0 % (ref 0.0–0.2)

## 2019-06-12 LAB — COMPREHENSIVE METABOLIC PANEL
ALT: 19 U/L (ref 0–44)
AST: 17 U/L (ref 15–41)
Albumin: 4.2 g/dL (ref 3.5–5.0)
Alkaline Phosphatase: 30 U/L — ABNORMAL LOW (ref 38–126)
Anion gap: 9 (ref 5–15)
BUN: 11 mg/dL (ref 6–20)
CO2: 26 mmol/L (ref 22–32)
Calcium: 9.2 mg/dL (ref 8.9–10.3)
Chloride: 100 mmol/L (ref 98–111)
Creatinine, Ser: 0.94 mg/dL (ref 0.44–1.00)
GFR calc Af Amer: >60 mL/min (ref >60)
GFR calc non Af Amer: >60 mL/min (ref >60)
Glucose, Bld: 139 mg/dL — ABNORMAL HIGH (ref 70–99)
Potassium: 3.9 mmol/L (ref 3.5–5.1)
Sodium: 135 mmol/L (ref 135–145)
Total Bilirubin: 0.7 mg/dL (ref 0.3–1.2)
Total Protein: 7.4 g/dL (ref 6.5–8.1)

## 2019-06-12 LAB — POCT PREGNANCY, URINE: Preg Test, Ur: NEGATIVE

## 2019-06-12 NOTE — Progress Notes (Signed)

## 2019-06-16 ENCOUNTER — Other Ambulatory Visit (HOSPITAL_COMMUNITY)
Admission: RE | Admit: 2019-06-16 | Discharge: 2019-06-16 | Disposition: A | Discharge status: Home / Self Care | Payer: PRIVATE HEALTH INSURANCE | Source: Ambulatory Visit | Attending: Surgery | Admitting: Surgery

## 2019-06-16 DIAGNOSIS — Z20828 Contact with and (suspected) exposure to other viral communicable diseases: Secondary | ICD-10-CM | POA: Diagnosis not present

## 2019-06-16 DIAGNOSIS — Z01812 Encounter for preprocedural laboratory examination: Secondary | ICD-10-CM | POA: Diagnosis not present

## 2019-06-17 LAB — NOVEL CORONAVIRUS, NAA (HOSP ORDER, SEND-OUT TO REF LAB; TAT 18-24 HRS): SARS-CoV-2, NAA: NOT DETECTED

## 2019-06-20 ENCOUNTER — Ambulatory Visit (HOSPITAL_BASED_OUTPATIENT_CLINIC_OR_DEPARTMENT_OTHER)
Admission: RE | Admit: 2019-06-20 | Discharge: 2019-06-20 | Disposition: A | Discharge status: Home / Self Care | Payer: PRIVATE HEALTH INSURANCE | Attending: Surgery | Admitting: Surgery

## 2019-06-20 ENCOUNTER — Other Ambulatory Visit: Payer: Self-pay

## 2019-06-20 ENCOUNTER — Ambulatory Visit (HOSPITAL_BASED_OUTPATIENT_CLINIC_OR_DEPARTMENT_OTHER): Payer: PRIVATE HEALTH INSURANCE | Admitting: Certified Registered"

## 2019-06-20 ENCOUNTER — Encounter (HOSPITAL_BASED_OUTPATIENT_CLINIC_OR_DEPARTMENT_OTHER): Payer: Self-pay

## 2019-06-20 ENCOUNTER — Encounter (HOSPITAL_BASED_OUTPATIENT_CLINIC_OR_DEPARTMENT_OTHER)
Admission: RE | Disposition: A | Discharge status: Home / Self Care | Payer: Self-pay | Source: Home / Self Care | Attending: Surgery

## 2019-06-20 DIAGNOSIS — N6489 Other specified disorders of breast: Secondary | ICD-10-CM | POA: Insufficient documentation

## 2019-06-20 DIAGNOSIS — E7800 Pure hypercholesterolemia, unspecified: Secondary | ICD-10-CM | POA: Insufficient documentation

## 2019-06-20 DIAGNOSIS — I10 Essential (primary) hypertension: Secondary | ICD-10-CM | POA: Insufficient documentation

## 2019-06-20 DIAGNOSIS — G43909 Migraine, unspecified, not intractable, without status migrainosus: Secondary | ICD-10-CM | POA: Insufficient documentation

## 2019-06-20 DIAGNOSIS — Z87891 Personal history of nicotine dependence: Secondary | ICD-10-CM | POA: Insufficient documentation

## 2019-06-20 DIAGNOSIS — Z6838 Body mass index (BMI) 38.0-38.9, adult: Secondary | ICD-10-CM | POA: Insufficient documentation

## 2019-06-20 DIAGNOSIS — Z79899 Other long term (current) drug therapy: Secondary | ICD-10-CM | POA: Insufficient documentation

## 2019-06-20 DIAGNOSIS — Z7984 Long term (current) use of oral hypoglycemic drugs: Secondary | ICD-10-CM | POA: Insufficient documentation

## 2019-06-20 HISTORY — DX: Essential (primary) hypertension: I10

## 2019-06-20 HISTORY — DX: Mixed hyperlipidemia: E78.2

## 2019-06-20 HISTORY — DX: Other specified behavioral and emotional disorders with onset usually occurring in childhood and adolescence: F98.8

## 2019-06-20 HISTORY — DX: Prediabetes: R73.03

## 2019-06-20 HISTORY — PX: BREAST LUMPECTOMY WITH RADIOACTIVE SEED LOCALIZATION: SHX6424

## 2019-06-20 SURGERY — BREAST LUMPECTOMY WITH RADIOACTIVE SEED LOCALIZATION
Anesthesia: General | Site: Breast | Laterality: Right

## 2019-06-20 MED ORDER — CEFAZOLIN SODIUM-DEXTROSE 2-4 GM/100ML-% IV SOLN
INTRAVENOUS | Status: AC
  Filled 2019-06-20: qty 100

## 2019-06-20 MED ORDER — LACTATED RINGERS IV SOLN
INTRAVENOUS | Status: DC
  Administered 2019-06-20 (×2): via INTRAVENOUS

## 2019-06-20 MED ORDER — GABAPENTIN 300 MG PO CAPS
ORAL_CAPSULE | ORAL | Status: AC
  Filled 2019-06-20: qty 1

## 2019-06-20 MED ORDER — CELECOXIB 200 MG PO CAPS
200.0000 mg | ORAL_CAPSULE | ORAL | Status: AC
  Administered 2019-06-20: 200 mg via ORAL

## 2019-06-20 MED ORDER — CHLORHEXIDINE GLUCONATE CLOTH 2 % EX PADS: 6.0000 | MEDICATED_PAD | Freq: Once | CUTANEOUS | Status: DC

## 2019-06-20 MED ORDER — MIDAZOLAM HCL 5 MG/5ML IJ SOLN
INTRAMUSCULAR | Status: DC | PRN
  Administered 2019-06-20: 2 mg via INTRAVENOUS

## 2019-06-20 MED ORDER — OXYCODONE HCL 5 MG/5ML PO SOLN: 5.0000 mg | Freq: Once | ORAL | Status: AC | PRN

## 2019-06-20 MED ORDER — HYDROCODONE-ACETAMINOPHEN 5-325 MG PO TABS: 1.0000 | ORAL_TABLET | Freq: Four times a day (QID) | ORAL | 0 refills | Status: DC | PRN

## 2019-06-20 MED ORDER — FENTANYL CITRATE (PF) 100 MCG/2ML IJ SOLN
INTRAMUSCULAR | Status: DC | PRN
  Administered 2019-06-20 (×2): 50 ug via INTRAVENOUS

## 2019-06-20 MED ORDER — ONDANSETRON HCL 4 MG/2ML IJ SOLN
INTRAMUSCULAR | Status: DC | PRN
  Administered 2019-06-20: 4 mg via INTRAVENOUS

## 2019-06-20 MED ORDER — MIDAZOLAM HCL 2 MG/2ML IJ SOLN
INTRAMUSCULAR | Status: AC
  Filled 2019-06-20: qty 2

## 2019-06-20 MED ORDER — EPHEDRINE 5 MG/ML INJ
INTRAVENOUS | Status: AC
  Filled 2019-06-20: qty 20

## 2019-06-20 MED ORDER — BUPIVACAINE HCL (PF) 0.25 % IJ SOLN
INTRAMUSCULAR | Status: DC | PRN
  Administered 2019-06-20: 26 mL

## 2019-06-20 MED ORDER — PROPOFOL 10 MG/ML IV BOLUS
INTRAVENOUS | Status: AC
  Filled 2019-06-20: qty 20

## 2019-06-20 MED ORDER — PROPOFOL 10 MG/ML IV BOLUS
INTRAVENOUS | Status: DC | PRN
  Administered 2019-06-20: 200 mg via INTRAVENOUS

## 2019-06-20 MED ORDER — LIDOCAINE 2% (20 MG/ML) 5 ML SYRINGE
INTRAMUSCULAR | Status: DC | PRN
  Administered 2019-06-20: 60 mg via INTRAVENOUS

## 2019-06-20 MED ORDER — CELECOXIB 200 MG PO CAPS
ORAL_CAPSULE | ORAL | Status: AC
  Filled 2019-06-20: qty 1

## 2019-06-20 MED ORDER — ACETAMINOPHEN 500 MG PO TABS
1000.0000 mg | ORAL_TABLET | ORAL | Status: AC
  Administered 2019-06-20: 1000 mg via ORAL

## 2019-06-20 MED ORDER — PHENYLEPHRINE 40 MCG/ML (10ML) SYRINGE FOR IV PUSH (FOR BLOOD PRESSURE SUPPORT)
PREFILLED_SYRINGE | INTRAVENOUS | Status: DC | PRN
  Administered 2019-06-20: 40 ug via INTRAVENOUS
  Administered 2019-06-20: 80 ug via INTRAVENOUS
  Administered 2019-06-20: 120 ug via INTRAVENOUS

## 2019-06-20 MED ORDER — OXYCODONE HCL 5 MG PO TABS
5.0000 mg | ORAL_TABLET | Freq: Once | ORAL | Status: AC | PRN
  Administered 2019-06-20: 5 mg via ORAL

## 2019-06-20 MED ORDER — FENTANYL CITRATE (PF) 100 MCG/2ML IJ SOLN: 25.0000 ug | INTRAMUSCULAR | Status: DC | PRN

## 2019-06-20 MED ORDER — DEXAMETHASONE SODIUM PHOSPHATE 4 MG/ML IJ SOLN
INTRAMUSCULAR | Status: DC | PRN
  Administered 2019-06-20: 10 mg via INTRAVENOUS

## 2019-06-20 MED ORDER — GABAPENTIN 300 MG PO CAPS
300.0000 mg | ORAL_CAPSULE | ORAL | Status: AC
  Administered 2019-06-20: 300 mg via ORAL

## 2019-06-20 MED ORDER — OXYCODONE HCL 5 MG PO TABS
ORAL_TABLET | ORAL | Status: AC
  Filled 2019-06-20: qty 1

## 2019-06-20 MED ORDER — IBUPROFEN 800 MG PO TABS: 800.0000 mg | ORAL_TABLET | Freq: Three times a day (TID) | ORAL | 0 refills | Status: DC | PRN

## 2019-06-20 MED ORDER — ONDANSETRON HCL 4 MG/2ML IJ SOLN: 4.0000 mg | Freq: Four times a day (QID) | INTRAMUSCULAR | Status: DC | PRN

## 2019-06-20 MED ORDER — FENTANYL CITRATE (PF) 100 MCG/2ML IJ SOLN
INTRAMUSCULAR | Status: AC
  Filled 2019-06-20: qty 2

## 2019-06-20 MED ORDER — ACETAMINOPHEN 500 MG PO TABS
ORAL_TABLET | ORAL | Status: AC
  Filled 2019-06-20: qty 2

## 2019-06-20 MED ORDER — CEFAZOLIN SODIUM-DEXTROSE 2-4 GM/100ML-% IV SOLN
2.0000 g | INTRAVENOUS | Status: AC
  Administered 2019-06-20: 2 g via INTRAVENOUS

## 2019-06-20 SURGICAL SUPPLY — 46 items
APPLIER CLIP 9.375 MED OPEN (MISCELLANEOUS)
BINDER BREAST LRG (GAUZE/BANDAGES/DRESSINGS) IMPLANT
BINDER BREAST XLRG (GAUZE/BANDAGES/DRESSINGS) IMPLANT
BINDER BREAST XXLRG (GAUZE/BANDAGES/DRESSINGS) ×1 IMPLANT
BLADE SURG 15 STRL LF DISP TIS (BLADE) ×1 IMPLANT
BLADE SURG 15 STRL SS (BLADE) ×1
CANISTER SUC SOCK COL 7IN (MISCELLANEOUS) IMPLANT
CANISTER SUCT 1200ML W/VALVE (MISCELLANEOUS) IMPLANT
CHLORAPREP W/TINT 26 (MISCELLANEOUS) ×2 IMPLANT
CLIP APPLIE 9.375 MED OPEN (MISCELLANEOUS) IMPLANT
COVER BACK TABLE REUSABLE LG (DRAPES) ×2 IMPLANT
COVER MAYO STAND REUSABLE (DRAPES) ×2 IMPLANT
COVER PROBE W GEL 5X96 (DRAPES) ×2 IMPLANT
COVER WAND RF STERILE (DRAPES) IMPLANT
DECANTER SPIKE VIAL GLASS SM (MISCELLANEOUS) IMPLANT
DERMABOND ADVANCED (GAUZE/BANDAGES/DRESSINGS) ×1
DERMABOND ADVANCED .7 DNX12 (GAUZE/BANDAGES/DRESSINGS) ×1 IMPLANT
DRAPE LAPAROTOMY 100X72 PEDS (DRAPES) ×2 IMPLANT
DRAPE UTILITY XL STRL (DRAPES) ×2 IMPLANT
ELECT COATED BLADE 2.86 ST (ELECTRODE) ×2 IMPLANT
ELECT REM PT RETURN 9FT ADLT (ELECTROSURGICAL) ×2
ELECTRODE REM PT RTRN 9FT ADLT (ELECTROSURGICAL) ×1 IMPLANT
GLOVE BIO SURGEON STRL SZ 6.5 (GLOVE) ×1 IMPLANT
GLOVE BIOGEL PI IND STRL 8 (GLOVE) ×1 IMPLANT
GLOVE BIOGEL PI INDICATOR 8 (GLOVE) ×1
GLOVE ECLIPSE 8.0 STRL XLNG CF (GLOVE) ×2 IMPLANT
GOWN STRL REUS W/ TWL LRG LVL3 (GOWN DISPOSABLE) ×2 IMPLANT
GOWN STRL REUS W/TWL LRG LVL3 (GOWN DISPOSABLE) ×2
HEMOSTAT ARISTA ABSORB 3G PWDR (HEMOSTASIS) IMPLANT
HEMOSTAT SNOW SURGICEL 2X4 (HEMOSTASIS) IMPLANT
KIT MARKER MARGIN INK (KITS) ×2 IMPLANT
NDL HYPO 25X1 1.5 SAFETY (NEEDLE) ×1 IMPLANT
NEEDLE HYPO 25X1 1.5 SAFETY (NEEDLE) ×2 IMPLANT
NS IRRIG 1000ML POUR BTL (IV SOLUTION) ×2 IMPLANT
PACK BASIN DAY SURGERY FS (CUSTOM PROCEDURE TRAY) ×2 IMPLANT
PENCIL SMOKE EVACUATOR (MISCELLANEOUS) ×2 IMPLANT
SLEEVE SCD COMPRESS KNEE MED (MISCELLANEOUS) ×2 IMPLANT
SPONGE LAP 4X18 RFD (DISPOSABLE) ×2 IMPLANT
SUT MNCRL AB 4-0 PS2 18 (SUTURE) ×2 IMPLANT
SUT SILK 2 0 SH (SUTURE) IMPLANT
SUT VICRYL 3-0 CR8 SH (SUTURE) ×2 IMPLANT
SYR CONTROL 10ML LL (SYRINGE) ×2 IMPLANT
TOWEL GREEN STERILE FF (TOWEL DISPOSABLE) ×2 IMPLANT
TRAY FAXITRON CT DISP (TRAY / TRAY PROCEDURE) ×2 IMPLANT
TUBE CONNECTING 20X1/4 (TUBING) IMPLANT
YANKAUER SUCT BULB TIP NO VENT (SUCTIONS) IMPLANT

## 2019-06-20 NOTE — Transfer of Care (Signed)
Immediate Anesthesia Transfer of Care Note  Patient: Christina Baldwin  Procedure(s) Performed: RIGHT BREAST LUMPECTOMY x2 WITH RADIOACTIVE SEED LOCALIZATION (Right Breast)  Patient Location: PACU  Anesthesia Type:General  Level of Consciousness: awake  Airway & Oxygen Therapy: Patient Spontanous Breathing and Patient connected to face mask oxygen  Post-op Assessment: Report given to RN and Post -op Vital signs reviewed and stable  Post vital signs: Reviewed and stable  Last Vitals:  Vitals Value Taken Time  BP    Temp    Pulse 83 06/20/19 1229  Resp 24 06/20/19 1229  SpO2 100 % 06/20/19 1229  Vitals shown include unvalidated device data.  Last Pain:  Vitals:   06/20/19 1015  TempSrc: Tympanic  PainSc: 0-No pain      Patients Stated Pain Goal: 6 ([id number])  Complications: No apparent anesthesia complications

## 2019-06-20 NOTE — Op Note (Signed)
Preoperative diagnosis: Right breast radial scar  Postop diagnosis: Same  Procedure: Right breast seed localized lumpectomy x2  Surgeon: Erroll Luna, MD  Anesthesia: LMA with local consisting of 0.25 per Sensorcaine with epinephrine  EBL: Minimal  Specimen: Right breast with 2 seeds and 2 clips sent separately as medial and lateral.  Drains: None  Indications for procedure: Patient is a 43 year old female who was found of a radial scar after mammogram and core biopsy.  She had a second area adjacent to it that showed Lingle.  She desired both areas to be removed even though the more medial lesion could have been left behind.  We discussed pros and cons and she agreed to have both areas removed same time.The procedure has been discussed with the patient. Alternatives to surgery have been discussed with the patient.  Risks of surgery include bleeding,  Infection,  Seroma formation, death,  and the need for further surgery.   The patient understands and wishes to proceed.   Description of procedure: Patient was met in the holding area.  Neoprobe was used to identify both seeds in her right breast.  Questions were answered.  She was taken back to the operating.  She is placed supine upon the OR table.  After induction of general esthesia, right breast was prepped and draped in sterile fashion and timeout performed.  Neoprobe was used to identify the seeds which were located in the right lower medial in the right lower lateral portions of the breast.  Incision was made on the lower part of the breast in a transverse fashion between both targets.  Dissection was carried medial and all tissue around the medial seed and clip were excised with a grossly negative margin.  The lateral lesion was excised in a separate fashion in a similar fashion.  Specimen radiograph revealed both seed and clip to be in each specimen and these were oriented with ink and sent to pathology.  The cavity was irrigated made  hemostatic with cautery.  Was then closed with 3-0 Vicryl for Monocryl.  Dermabond applied.  All final counts found to be correct.  Breast binder placed.  The patient was awoke extubated taken recovery in satisfactory condition.

## 2019-06-20 NOTE — Interval H&P Note (Signed)
History and Physical Interval Note:  06/20/2019 10:57 AM  Christina Baldwin  has presented today for surgery, with the diagnosis of RADIAL SCAR RIGHT BREAST MASS.  The various methods of treatment have been discussed with the patient and family. After consideration of risks, benefits and other options for treatment, the patient has consented to  Procedure(s): RIGHT BREAST LUMPECTOMY x2 WITH RADIOACTIVE SEED LOCALIZATION (Right) as a surgical intervention.  The patient's history has been reviewed, patient examined, no change in status, stable for surgery.  I have reviewed the patient's chart and labs.  Questions were answered to the patient's satisfaction.     [place]

## 2019-06-20 NOTE — Anesthesia Preprocedure Evaluation (Signed)
Anesthesia Evaluation  Patient identified by MRN, date of birth, ID band Patient awake    Reviewed: Allergy & Precautions, H&P , NPO status , Patient's Chart, lab work & pertinent test results  Airway Mallampati: II   Neck ROM: full    Dental   Pulmonary neg pulmonary ROS,    breath sounds clear to auscultation       Cardiovascular hypertension,  Rhythm:regular Rate:Normal     Neuro/Psych    GI/Hepatic   Endo/Other  Morbid obesity  Renal/GU      Musculoskeletal   Abdominal   Peds  Hematology   Anesthesia Other Findings   Reproductive/Obstetrics                             Anesthesia Physical Anesthesia Plan  ASA: II  Anesthesia Plan: General   Post-op Pain Management:    Induction: Intravenous  PONV Risk Score and Plan: 3 and Ondansetron, Dexamethasone, Scopolamine patch - Pre-op and Treatment may vary due to age or medical condition  Airway Management Planned: LMA  Additional Equipment:   Intra-op Plan:   Post-operative Plan:   Informed Consent: I have reviewed the patients History and Physical, chart, labs and discussed the procedure including the risks, benefits and alternatives for the proposed anesthesia with the patient or authorized representative who has indicated his/her understanding and acceptance.       Plan Discussed with: CRNA, Anesthesiologist and Surgeon  Anesthesia Plan Comments:         Anesthesia Quick Evaluation

## 2019-06-20 NOTE — Discharge Instructions (Signed)
No Tylenol or NSAIDS (Ibuprofen/Advil) before 4:30pm today.  [place] Office Phone Number [phone]  BREAST BIOPSY/ PARTIAL MASTECTOMY: POST OP INSTRUCTIONS  Always review your discharge instruction sheet given to you by the facility where your surgery was performed.  IF YOU HAVE DISABILITY OR FAMILY LEAVE FORMS, YOU MUST BRING THEM TO THE OFFICE FOR PROCESSING.  DO NOT GIVE THEM TO YOUR DOCTOR.  1. A prescription for pain medication may be given to you upon discharge.  Take your pain medication as prescribed, if needed.  If narcotic pain medicine is not needed, then you may take acetaminophen (Tylenol) or ibuprofen (Advil) as needed. 2. Take your usually prescribed medications unless otherwise directed 3. If you need a refill on your pain medication, please contact your pharmacy.  They will contact our office to request authorization.  Prescriptions will not be filled after 5pm or on week-ends. 4. You should eat very light the first 24 hours after surgery, such as soup, crackers, pudding, etc.  Resume your normal diet the day after surgery. 5. Most patients will experience some swelling and bruising in the breast.  Ice packs and a good support bra will help.  Swelling and bruising can take several days to resolve.  6. It is common to experience some constipation if taking pain medication after surgery.  Increasing fluid intake and taking a stool softener will usually help or prevent this problem from occurring.  A mild laxative (Milk of Magnesia or Miralax) should be taken according to package directions if there are no bowel movements after 48 hours. 7. Unless discharge instructions indicate otherwise, you may remove your bandages 24-48 hours after surgery, and you may shower at that time.  You may have steri-strips (small skin tapes) in place directly over the incision.  These strips should be left on the skin for 7-10 days.  If your surgeon used skin glue on the incision,  you may shower in 24 hours.  The glue will flake off over the next 2-3 weeks.  Any sutures or staples will be removed at the office during your follow-up visit. 8. ACTIVITIES:  You may resume regular daily activities (gradually increasing) beginning the next day.  Wearing a good support bra or sports bra minimizes pain and swelling.  You may have sexual intercourse when it is comfortable. a. You may drive when you no longer are taking prescription pain medication, you can comfortably wear a seatbelt, and you can safely maneuver your car and apply brakes. b. RETURN TO WORK:  ______________________________________________________________________________________ 9. You should see your doctor in the office for a follow-up appointment approximately two weeks after your surgery.  Your doctors nurse will typically make your follow-up appointment when she calls you with your pathology report.  Expect your pathology report 2-3 business days after your surgery.  You may call to check if you do not hear from [place] after three days. 10. OTHER INSTRUCTIONS: _______________________________________________________________________________________________ _____________________________________________________________________________________________________________________________________ _____________________________________________________________________________________________________________________________________ _____________________________________________________________________________________________________________________________________  WHEN TO CALL YOUR DOCTOR: 1. Fever over 101.0 2. Nausea and/or vomiting. 3. Extreme swelling or bruising. 4. Continued bleeding from incision. 5. Increased pain, redness, or drainage from the incision.  The clinic staff is available to answer your questions during regular business hours.  Please dont hesitate to call and ask to speak to one of the nurses for clinical  concerns.  If you have a medical emergency, go to the nearest emergency room or call 911.  A surgeon from [facility] is always on call at the  hospital.  For further questions, please visit [email or web address]

## 2019-06-20 NOTE — Anesthesia Procedure Notes (Signed)
Procedure Name: LMA Insertion Date/Time: 06/20/2019 11:25 AM Performed by: Lieutenant Diego, CRNA Pre-anesthesia Checklist: Patient identified, Emergency Drugs available, Suction available and Patient being monitored Patient Re-evaluated:Patient Re-evaluated prior to induction Oxygen Delivery Method: Circle system utilized Preoxygenation: Pre-oxygenation with 100% oxygen Induction Type: IV induction Ventilation: Mask ventilation without difficulty LMA: LMA inserted LMA Size: 4.0 Number of attempts: 1 Placement Confirmation: positive ETCO2 and breath sounds checked- equal and bilateral Tube secured with: Tape Dental Injury: Teeth and Oropharynx as per pre-operative assessment

## 2019-06-21 ENCOUNTER — Encounter (HOSPITAL_BASED_OUTPATIENT_CLINIC_OR_DEPARTMENT_OTHER): Payer: Self-pay | Admitting: Surgery

## 2019-06-22 ENCOUNTER — Other Ambulatory Visit: Payer: Self-pay

## 2019-06-22 NOTE — Anesthesia Postprocedure Evaluation (Signed)
Anesthesia Post Note  Patient: [name]  Procedure(s) Performed: RIGHT BREAST LUMPECTOMY x2 WITH RADIOACTIVE SEED LOCALIZATION (Right Breast)     Patient location during evaluation: PACU Anesthesia Type: General Level of consciousness: awake and alert Pain management: pain level controlled Vital Signs Assessment: post-procedure vital signs reviewed and stable Respiratory status: spontaneous breathing, nonlabored ventilation, respiratory function stable and patient connected to nasal cannula oxygen Cardiovascular status: blood pressure returned to baseline and stable Postop Assessment: no apparent nausea or vomiting Anesthetic complications: no    Last Vitals:  Vitals:   06/20/19 1245 06/20/19 1315  BP: 108/69 107/66  Pulse: 73 67  Resp: (!) 9 16  Temp:  37.2 C  SpO2: 98% 98%    Last Pain:  Vitals:   06/20/19 1315  TempSrc:   PainSc: [place]

## 2019-06-25 ENCOUNTER — Encounter: Payer: Self-pay | Admitting: Licensed Clinical Social Worker

## 2019-06-25 ENCOUNTER — Telehealth: Payer: Self-pay | Admitting: Licensed Clinical Social Worker

## 2019-06-25 DIAGNOSIS — Z1379 Encounter for other screening for genetic and chromosomal anomalies: Secondary | ICD-10-CM | POA: Insufficient documentation

## 2019-06-25 LAB — SURGICAL PATHOLOGY

## 2019-06-25 NOTE — Telephone Encounter (Signed)
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 Discussed that there are no current management guidelines associated with this gene, but that it is thought to be associated with breast cancer. Based on her Harriett Rush, she is considered high risk for breast cancer and would like referral to the high risk breast clinic.

## 2019-06-26 ENCOUNTER — Ambulatory Visit: Payer: Self-pay | Admitting: Licensed Clinical Social Worker

## 2019-06-26 DIAGNOSIS — Z8049 Family history of malignant neoplasm of other genital organs: Secondary | ICD-10-CM

## 2019-06-26 DIAGNOSIS — Z803 Family history of malignant neoplasm of breast: Secondary | ICD-10-CM

## 2019-06-26 DIAGNOSIS — Z801 Family history of malignant neoplasm of trachea, bronchus and lung: Secondary | ICD-10-CM

## 2019-06-26 DIAGNOSIS — Z1379 Encounter for other screening for genetic and chromosomal anomalies: Secondary | ICD-10-CM

## 2019-06-26 NOTE — Progress Notes (Signed)
Genetic Test Results  HPI:  Christina Baldwin was previously seen in the [place] clinic due to a family history of cancer and concerns regarding a hereditary predisposition to cancer. Please refer to our prior cancer genetics clinic note for more information regarding our discussion, assessment and recommendations, at the time. Christina Baldwin's recent genetic test results were disclosed to her, as were recommendations warranted by these results. These results and recommendations are discussed in more detail below.  Christina Baldwin does not have a history of cancer. She had 2 breast biopsies on 05/09/2019, 1 of which showed [place] which she had lumpectomy for on 06/20/2019.   CANCER HISTORY:  Oncology History   No history exists.    FAMILY HISTORY:  We obtained a detailed, 4-generation family history.  Significant diagnoses are listed below: Family History  Problem Relation Age of Onset  . Breast cancer Maternal Aunt        dx early 73s  . Leukemia Maternal Grandfather   . Lung cancer Paternal Grandmother   . Breast cancer Other   . Cervical cancer Cousin   . Lung cancer Cousin   . Breast cancer Maternal Great-grandmother   . Breast cancer Cousin        dx 9s    Christina Baldwin has 1 son, 78, no history of cancer. She has 1 brother, 64, no history of cancer and had 1 sister who passed at 21 days old.   Christina Baldwin's mother is living at 23 and has had lumps removed from her breast ,but no breast cancer. Patient has 1 maternal aunt, 1 maternal uncle. Her aunt had breast cancer in her early 82s and is living in her 99s. Her uncle had 3 daughters and 2 had cervical cancer, and the other had lung cancer. Patient's maternal grandfather had leukemia and died at 13. Maternal grandmother died at 58, no cancer. This grandmother's sister, patient's great aunt, had breast cancer, and her daughter (patient's first cousin once removed) had breast cancer in her 94s. Additionally, patient's  grandmother's mother had breast cancer.  Christina Baldwin's father is living at 64. She has limited information about his side of the family. Unaware of paternal aunts/uncles/cousins. Her paternal grandmother is living at 61 and had lung cancer in her 47s. Paternal grandfather died by suicide.   Christina Baldwin is unaware of previous family history of genetic testing for hereditary cancer risks. Patient's maternal ancestors are of Irish/British descent, and paternal ancestors are of [place] descent. There is no reported Ashkenazi Jewish ancestry. There is no known consanguinity.  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 The remainder of the testing was negative.  The CancerNext-Expanded + RNAinsight gene panel offered by [employer] and includes sequencing and rearrangement analysis for the following 77 genes: IP, ALK, APC*, ATM*, AXIN2, BAP1, BARD1, BLM, BMPR1A, BRCA1*, BRCA2*, BRIP1*, CDC73, CDH1*,CDK4, CDKN1B, CDKN2A, CHEK2*, CTNNA1, DICER1, FANCC, FH, FLCN, GALNT12, KIF1B, LZTR1, MAX, MEN1, MET, MLH1*, MSH2*, MSH3, MSH6*, MUTYH*, NBN, NF1*, NF2, NTHL1, PALB2*, PHOX2B, PMS2*, POT1, PRKAR1A, PTCH1, PTEN*, RAD51C*, RAD51D*,RB1, RECQL, RET, SDHA, SDHAF2, SDHB, SDHC, SDHD, SMAD4, SMARCA4, SMARCB1, SMARCE1, STK11, SUFU, TMEM127, TP53*,TSC1, TSC2, VHL and XRCC2 (sequencing and deletion/duplication); EGFR, EGLN1, HOXB13, KIT, MITF, PDGFRA, POLD1 and POLE (sequencing only); EPCAM and GREM1 (deletion/duplication only). DNA and RNA analyses performed for * genes.   The test report has been scanned into EPIC and is  located under the Molecular Pathology section of the Results Review tab.  A portion of the result report is included below for reference.     We discussed with Christina Baldwin that because current genetic testing is not perfect, it is possible there may be a gene  mutation in one of these genes that current testing cannot detect, but that chance is small.  We also discussed, that there could be another gene that has not yet been discovered, or that we have not yet tested, that is responsible for the cancer diagnoses in the family. For example, it is unclear whether this result could explain the cancer at young ages on her maternal side.  It is also possible there is a hereditary cause for the cancer in the family that Christina Baldwin did not inherit and therefore was not identified in her testing.  Therefore, it is important to remain in touch with cancer genetics in the future so that we can continue to offer Christina Baldwin the most up to date genetic testing.   DISCUSSION: XRCC2  Clinical condition  While there is currently no well-established data to associate the [place] gene with a specific disease or condition, there is preliminary evidence suggesting variants in this gene may be associated with a predisposition for breast cancer (PMID: 46659935, 70177939, [id number], [id number]) and Fanconi anemia (PMID: 33545625). These data, however, are currently insufficient to make a clear determination regarding this association.   Gene information XRCC2 encodes a member of the RecA/Rad51-related protein family that participates in homologous recombination to maintain chromosome stability and repair DNA damage. This gene is involved in the repair of DNA double-strand breaks by homologous recombination.  Inheritance The preliminary evidence that XRCC2 may be associated with breast cancer suggests dominant inheritance. This means that an individual with such a variant has a 50% chance of passing the variant on to their offspring. The preliminary evidence that XRCC2 may have an association with Fanconi anemia suggests autosomal recessive inheritance. An autosomal recessive condition results when an individual inherits a pathogenic variant from each parent. For there to be a risk to  have offspring, both parents would have to have a pathogenic variant; in such a case, the risk of having an affected child is 25%.  Management Because the evidence regarding XRCC2 and breast cancer risk is limited and preliminary, there are no guidelines or recommendations to suggest alteration to medical management based solely on the presence of a XRCC2 variant. However, an individual's cancer risk and medical management are not determined by genetic test results alone. Overall cancer risk assessment incorporates additional factors including personal medical history, family history, and any available genetic information that may result in a personalized plan for cancer prevention and surveillance.  It is advantageous to know if a variant is present even though the data regarding XRCC2 are currently preliminary. At-risk relatives can be identified, allowing pursuit of a diagnostic evaluation. In addition, the available information regarding hereditary cancer susceptibility genes is constantly evolving and clinically relevant XRCC2 data is likely to become available in the near future. Awareness of this variant encourages patients and their providers to inform at-risk family members, to consider implementing established screening protocols, and to be vigilant in maintaining close and regular contact with their local genetics clinic in anticipation of new information.  Based on Christina Baldwin's personal biopsy history and family history of cancer, as well as her genetic test results, statistical model Harriett Rush was used to estimate her risk of developing breast cancer.  These estimate her lifetime risk of developing breast cancer to be approximately 24%. A "negative" genetic test result was used in this calculation since it is unclear how much the XRCC2 gene increases her risk, therefore this risk is likely to be higher than 24%.  The patient's lifetime breast cancer risk is a preliminary estimate based on  available information using one of several models endorsed by the [place] (ACS). The ACS recommends consideration of breast MRI screening as an adjunct to mammography for patients at high risk (defined as 20% or greater lifetime risk).     Ms. Marlar has been determined to be at high risk for breast cancer.  Therefore, we recommend that annual screening with mammography and breast MRI begin.  Ms. Shackett would like to be referred to our high risk clinic to discuss her options further.   FAMILY MEMBERS: It is important that all relatives know about this mutation. Her first degree relatives have a 50% chance to also have this mutation. We recommend that they have genetic testing and counseling. Her son can consider testing once he is 23 or in his early 90s.   It is also possible there is a hereditary cause for the cancer in Christina Baldwin's family that she did not inherit and therefore was not identified in her.  Based on Christina Baldwin's family history, we recommended her maternal relatives have genetic counseling and testing as there could be a different gene responsible for the breast cancer in her family. Christina Baldwin will let us know if we can be of any assistance in coordinating genetic counseling and/or testing for these family members.  PLAN  1. Christina Baldwin would like to be referred to the high risk clinic to discuss her options. This report will also be shared with her referring provider and PCP.   2. Christina Baldwin will share this information with her family members, and let us know if we can help in coordinating any testing.   FOLLOW-UP: We encouraged her to remain in contact with cancer genetics on an annual basis so we can update her personal and family histories and let her know of advances in cancer genetics that may benefit this family.   Our contact number was provided. Christina Baldwin's questions were answered to her satisfaction, and she knows she is welcome to call us at  anytime with additional questions or concerns.   Faith Rogue, MS, [facility] Genetic Counselor [email or web address] Phone: [phone]

## 2019-07-02 ENCOUNTER — Telehealth: Payer: Self-pay | Admitting: Licensed Clinical Social Worker

## 2019-07-02 NOTE — Telephone Encounter (Signed)
Received a msg to schedule Christina Baldwin for the high risk breast clinic. I cld and the pt a vm to callback. [place] Ar has been updated.

## 2019-08-14 ENCOUNTER — Other Ambulatory Visit: Payer: PRIVATE HEALTH INSURANCE

## 2019-09-03 NOTE — Progress Notes (Signed)
[facility] CONSULT NOTE  Patient Care Team: Elinor Parkinson as PCP - General (Physician Assistant)  CHIEF COMPLAINTS/PURPOSE OF CONSULTATION:  Newly diagnosed high risk for breast cancer  HISTORY OF PRESENTING ILLNESS:  Christina Baldwin 44 y.o. female is here because of recent diagnosis of high risk for breast cancer. Mammogram showed 2 abnormal sites in the right breast. 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 She underwent a right lumpectomy x2 with Dr. 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 She presents to the clinic today for initial evaluation and discussion of surveillance options.   I reviewed her records extensively and collaborated the history with the patient.  SUMMARY OF ONCOLOGIC HISTORY: Oncology History   No history exists.    MEDICAL HISTORY:  Past Medical History:  Diagnosis Date  . ADD (attention deficit disorder)   . Family history of breast cancer   . Family history of cervical cancer   . Family history of lung cancer   . Hypertension   . Mixed hyperlipidemia   . Pre-diabetes     SURGICAL HISTORY: Past Surgical History:  Procedure Laterality Date  . BREAST LUMPECTOMY WITH RADIOACTIVE SEED LOCALIZATION Right 06/20/2019   Procedure: RIGHT BREAST LUMPECTOMY x2 WITH RADIOACTIVE SEED LOCALIZATION;  Surgeon: Erroll Luna, MD;  Location: [facility];  Service: General;  Laterality: Right;  . ENDOMETRIAL ABLATION    . HAND SURGERY    . TUMOR REMOVAL      SOCIAL HISTORY: Social History   Socioeconomic History  . Marital status: Single    Spouse name: Not on file  . Number of children: Not on file  . Years of education: Not on file  . Highest education level: Not on file  Occupational  History  . Not on file  Tobacco Use  . Smoking status: Never Smoker  . Smokeless tobacco: Never Used  Substance and Sexual Activity  . Alcohol use: Yes    Comment: occas  . Drug use: Never  . Sexual activity: Not on file  Other Topics Concern  . Not on file  Social History Narrative  . Not on file   Social Determinants of Health   Financial Resource Strain:   . Difficulty of Paying Living Expenses: Not on file  Food Insecurity:   . Worried About Charity fundraiser in the Last Year: Not on file  . Ran Out of Food in the Last Year: Not on file  Transportation Needs:   . Lack of Transportation (Medical): Not on file  . Lack of Transportation (Non-Medical): Not on file  Physical Activity:   . Days of Exercise per Week: Not on file  . Minutes of Exercise per Session: Not on file  Stress:   . Feeling of Stress : Not on file  Social Connections:   . Frequency of Communication with Friends and Family: Not on file  . Frequency of Social Gatherings with Friends and Family: Not on file  . Attends Religious Services: Not on file  . Active Member of Clubs or Organizations: Not on file  . Attends Archivist Meetings: Not on file  . Marital Status: Not on file  Intimate Partner Violence:   . Fear of Current or Ex-Partner: Not on file  . Emotionally Abused: Not  on file  . Physically Abused: Not on file  . Sexually Abused: Not on file    FAMILY HISTORY: Family History  Problem Relation Age of Onset  . Breast cancer Maternal Aunt        dx early 36s  . Leukemia Maternal Grandfather   . Lung cancer Paternal Grandmother   . Breast cancer Other   . Cervical cancer Cousin   . Lung cancer Cousin   . Breast cancer Maternal Great-grandmother   . Breast cancer Cousin        dx 38s    ALLERGIES:  is allergic to sulfa antibiotics.  MEDICATIONS:  Current Outpatient Medications  Medication Sig Dispense Refill  . Calcium Polycarbophil (FIBER-CAPS PO) Take by mouth.    .  cloNIDine (CATAPRES) 0.1 MG tablet Take 0.1 mg by mouth 2 (two) times daily.    Marland Kitchen HYDROcodone-acetaminophen (NORCO/VICODIN) 5-325 MG tablet Take 1 tablet by mouth every 6 (six) hours as needed for moderate pain. 15 tablet 0  . ibuprofen (ADVIL) 800 MG tablet Take 1 tablet (800 mg total) by mouth every 8 (eight) hours as needed. 30 tablet 0  . lisinopril (ZESTRIL) 10 MG tablet Take 10 mg by mouth daily.    . rosuvastatin (CRESTOR) 20 MG tablet Take 20 mg by mouth daily.    . SUMAtriptan (IMITREX) 50 MG tablet Take 50 mg by mouth every 2 (two) hours as needed for migraine. May repeat in 2 hours if headache persists or recurs.     No current facility-administered medications for this visit.    REVIEW OF SYSTEMS:   Constitutional: Denies fevers, chills or abnormal night sweats Eyes: Denies blurriness of vision, double vision or watery eyes Ears, nose, mouth, throat, and face: Denies mucositis or sore throat Respiratory: Denies cough, dyspnea or wheezes Cardiovascular: Denies palpitation, chest discomfort or lower extremity swelling Gastrointestinal:  Denies nausea, heartburn or change in bowel habits Skin: Denies abnormal skin rashes Lymphatics: Denies new lymphadenopathy or easy bruising Neurological:Denies numbness, tingling or new weaknesses Behavioral/Psych: Mood is stable, no new changes  Breast: Denies any palpable lumps or discharge All other systems were reviewed with the patient and are negative.  PHYSICAL EXAMINATION: ECOG PERFORMANCE STATUS: 0 - Asymptomatic  Vitals:   09/04/19 1318  BP: 130/78  Pulse: 80  Resp: 16  Temp: 98.2 F (36.8 C)  SpO2: 100%   Filed Weights   09/04/19 1318  Weight: 204 lb 4.8 oz (92.7 kg)    GENERAL:alert, no distress and comfortable SKIN: skin color, texture, turgor are normal, no rashes or significant lesions EYES: normal, conjunctiva are pink and non-injected, sclera clear OROPHARYNX:no exudate, no erythema and lips, buccal mucosa, and  tongue normal  NECK: supple, thyroid normal size, non-tender, without nodularity LYMPH:  no palpable lymphadenopathy in the cervical, axillary or inguinal LUNGS: clear to auscultation and percussion with normal breathing effort HEART: regular rate & rhythm and no murmurs and no lower extremity edema ABDOMEN:abdomen soft, non-tender and normal bowel sounds Musculoskeletal:no cyanosis of digits and no clubbing  PSYCH: alert & oriented x 3 with fluent speech NEURO: no focal motor/sensory deficits    LABORATORY DATA:  I have reviewed the data as listed Lab Results  Component Value Date   WBC 9.5 06/12/2019   HGB 12.0 06/12/2019   HCT 37.4 06/12/2019   MCV 93.5 06/12/2019   PLT 400 06/12/2019   Lab Results  Component Value Date   NA 135 06/12/2019   K 3.9 06/12/2019   CL  100 06/12/2019   CO2 26 06/12/2019    RADIOGRAPHIC STUDIES: I have personally reviewed the radiological reports and agreed with the findings in the report.  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 Lifetime risk estimates are not available.  Patient has a singularly.  Sometimes it can be associate with Fanconi's anemia (Biallelic)  Harriett Rush was used to estimate her risk of developing breast cancer. These estimate her lifetime risk of developing breast cancer to be approximately 24% Because she has the gene mutation which does increase her risk of breast cancer and the lifetime risk of breast cancer by the [employer] model is 24%, I believe that her risk of breast cancer is much higher than 24%.  (Average risk 10.6%) 10-year risk of breast cancer: 4.7% (average risk 1.9%)  Breast cancer  surveillance: Annual mammograms, breast MRIs versus patient's choice of bilateral prophylactic mastectomies.  She is in discussion with Dr. Brantley Stage and will see plastic surgery to discuss her options. She tells me that she would constantly be worried about breast cancer and also worried about findings on the MRI which will require additional biopsies in the future.  Risk reduction: Apart from exercise and eating healthy with lots of fruits and vegetables, I do not recommend any antiestrogen therapy for breast reduction.  We discussed the risks and benefits of tamoxifen and decided against taking it.  Patient will be seen on an annual basis if she opts for breast MRIs and mammograms and follow-ups. However if she does bilateral mastectomies and she does not need to follow [place].    All questions were answered. The patient knows to call the clinic with any problems, questions or concerns.   Rulon Eisenmenger, MD, MPH 09/04/2019    I, Molly Dorshimer, am acting as scribe for Nicholas Lose, MD.  I have reviewed the above documentation for accuracy and completeness, and I agree with the above.

## 2019-09-04 ENCOUNTER — Inpatient Hospital Stay: Payer: Managed Care, Other (non HMO) | Attending: Hematology and Oncology | Admitting: Hematology and Oncology

## 2019-09-04 ENCOUNTER — Other Ambulatory Visit: Payer: Self-pay

## 2019-09-04 DIAGNOSIS — E782 Mixed hyperlipidemia: Secondary | ICD-10-CM | POA: Insufficient documentation

## 2019-09-04 DIAGNOSIS — Z803 Family history of malignant neoplasm of breast: Secondary | ICD-10-CM | POA: Insufficient documentation

## 2019-09-04 DIAGNOSIS — R928 Other abnormal and inconclusive findings on diagnostic imaging of breast: Secondary | ICD-10-CM | POA: Insufficient documentation

## 2019-09-04 DIAGNOSIS — Z791 Long term (current) use of non-steroidal anti-inflammatories (NSAID): Secondary | ICD-10-CM | POA: Diagnosis not present

## 2019-09-04 DIAGNOSIS — Z801 Family history of malignant neoplasm of trachea, bronchus and lung: Secondary | ICD-10-CM | POA: Diagnosis not present

## 2019-09-04 DIAGNOSIS — Z79899 Other long term (current) drug therapy: Secondary | ICD-10-CM | POA: Insufficient documentation

## 2019-09-04 DIAGNOSIS — Z8049 Family history of malignant neoplasm of other genital organs: Secondary | ICD-10-CM | POA: Diagnosis not present

## 2019-09-04 DIAGNOSIS — I10 Essential (primary) hypertension: Secondary | ICD-10-CM | POA: Insufficient documentation

## 2019-09-04 DIAGNOSIS — Z9189 Other specified personal risk factors, not elsewhere classified: Secondary | ICD-10-CM | POA: Diagnosis not present

## 2019-09-04 NOTE — Assessment & Plan Note (Signed)
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 Lifetime risk estimates are not available.  Patient has a singularly.  Sometimes it can be associate with Fanconi's anemia (Biallelic)  Harriett Rush was used to estimate her risk of developing breast cancer. These estimate her lifetime risk of developing breast cancer to be approximately 24% Because she has the gene mutation which does increase her risk of breast cancer and the lifetime risk of breast cancer by the [employer] model is 24%, I believe that her risk of breast cancer is much higher than 24%.  (Average risk 10.6%) 10-year risk of breast cancer: 4.7% (average risk 1.9%)  Breast cancer surveillance: Annual mammograms, breast MRIs Risk reduction: Apart from exercise and eating healthy with lots of fruits and vegetables, I do not recommend any antiestrogen therapy for breast reduction.  We discussed the risks and benefits of tamoxifen and decided against taking it.  Patient will be seen on an annual basis for breast MRIs and mammograms and follow-ups.

## 2019-09-21 ENCOUNTER — Other Ambulatory Visit: Payer: Self-pay | Admitting: Surgery

## 2019-09-21 DIAGNOSIS — Z9189 Other specified personal risk factors, not elsewhere classified: Secondary | ICD-10-CM

## 2019-10-08 ENCOUNTER — Other Ambulatory Visit (HOSPITAL_COMMUNITY): Payer: Self-pay | Admitting: Surgery

## 2019-10-08 DIAGNOSIS — Z9189 Other specified personal risk factors, not elsewhere classified: Secondary | ICD-10-CM

## 2019-10-16 ENCOUNTER — Ambulatory Visit (HOSPITAL_COMMUNITY)
Admission: RE | Admit: 2019-10-16 | Discharge: 2019-10-16 | Disposition: A | Discharge status: Home / Self Care | Payer: Managed Care, Other (non HMO) | Source: Ambulatory Visit | Attending: Surgery | Admitting: Surgery

## 2019-10-16 DIAGNOSIS — Z9189 Other specified personal risk factors, not elsewhere classified: Secondary | ICD-10-CM | POA: Diagnosis present

## 2019-10-16 LAB — POCT I-STAT CREATININE: Creatinine, Ser: 0.8 mg/dL (ref 0.44–1.00)

## 2019-10-16 IMAGING — MR MR BREAST BILAT WO/W CM
7 of 10 series · 30 of 48 positions shown · IV contrast (gadavist)
Comparison: Previous exam(s).

CLINICAL DATA: 43-year-old female status post right lumpectomy for
complex sclerosing lesion in [DATE] presents for high risk
screening breast MRI.

LABS:  None performed on site.
EXAM:
BILATERAL BREAST MRI WITH AND WITHOUT CONTRAST
TECHNIQUE: Multiplanar, multisequence MR images of both breasts were obtained
prior to and following the intravenous administration of 10 ml of
Gadavist.

[Series 2: T2 · axial · 3.0mm · 0.89mm/px · z∈[-104,+73]mm · 3 of 46 slices shown]
[im 1/46]
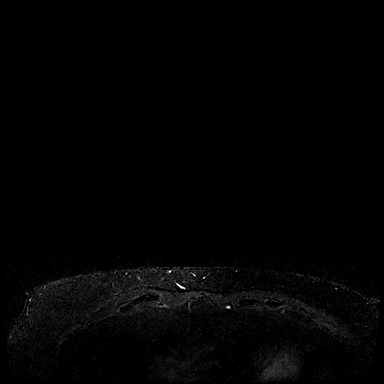
[im 23/46]
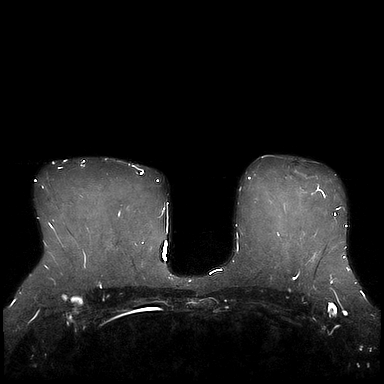
[im 46/46]
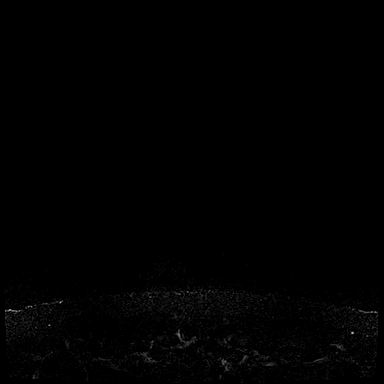

[Series 4: T1 fat-sat · axial · 1.2mm · 0.71mm/px · z∈[-87,+66]mm · 5 of 128 slices shown (1 of 5)]
[im 1/128]
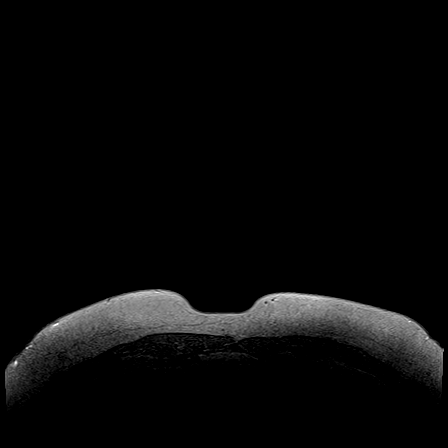
[im 32/128]
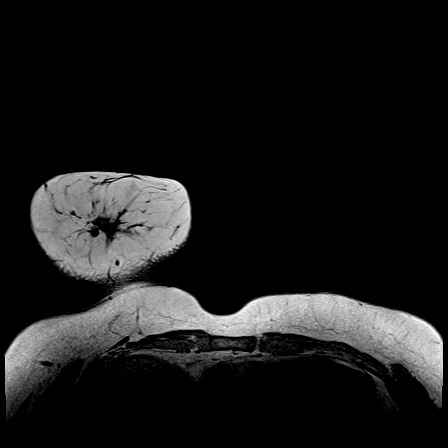
[im 64/128]
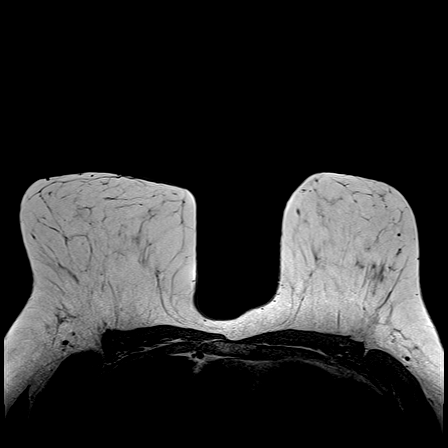
[im 96/128]
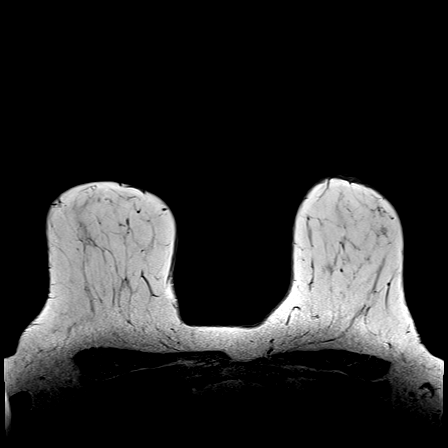
[im 128/128]
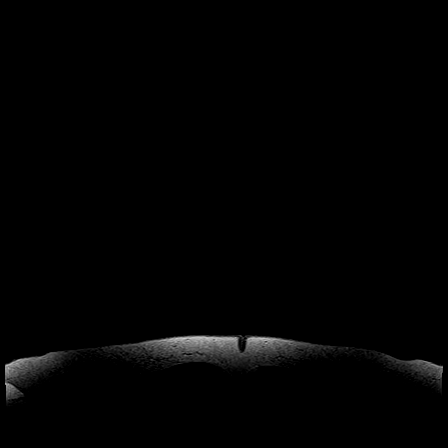

[Series 5: T1 fat-sat · axial · 1.2mm · 0.87mm/px · z∈[-79,+64]mm · 5 of 119 slices shown (2 of 5)]
[im 1/119]
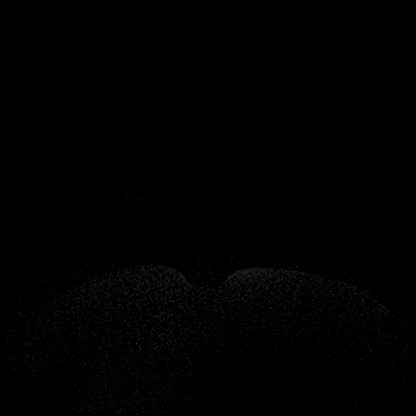
[im 30/119]
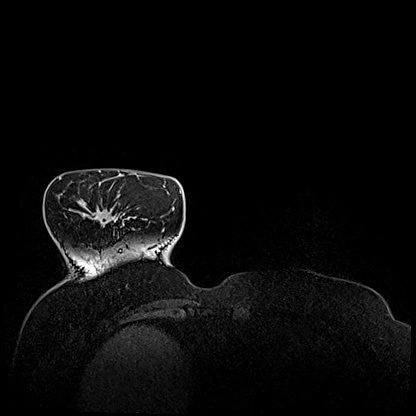
[im 60/119]
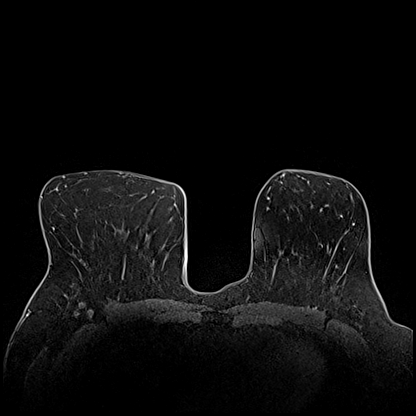
[im 89/119]
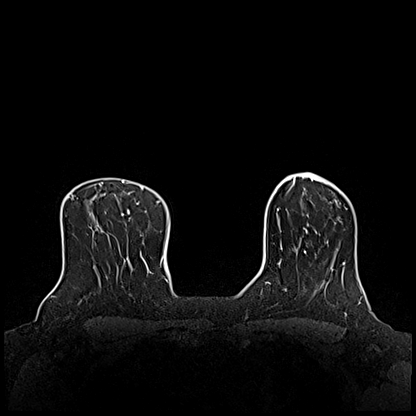
[im 119/119]
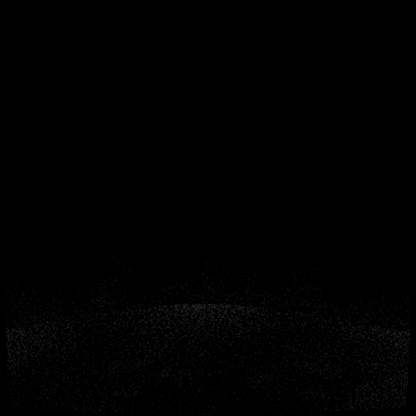

[Series 6: T1 fat-sat · axial · 1.2mm · 0.87mm/px · z∈[-79,+64]mm · 5 of 119 slices shown (3 of 5)]
[im 1/119]
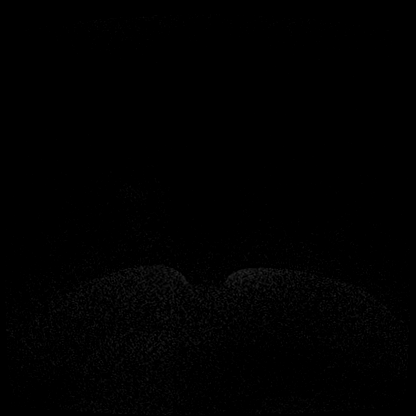
[im 30/119]
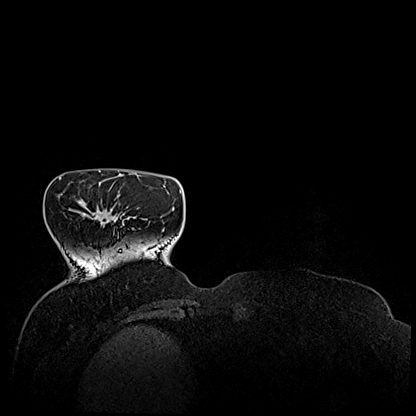
[im 60/119]
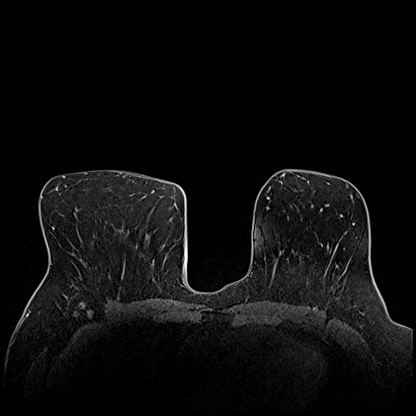
[im 89/119]
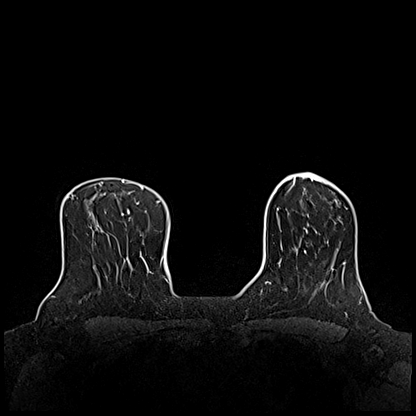
[im 119/119]
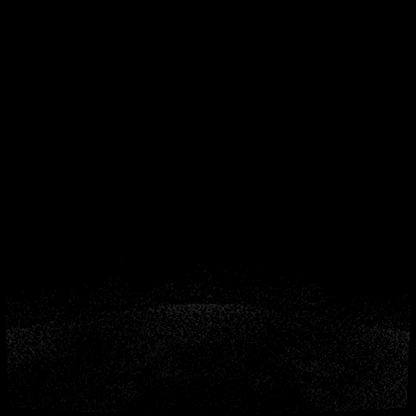

[Series 7: T1 fat-sat · axial · 1.2mm · 0.87mm/px · z∈[-79,+64]mm · 5 of 120 slices shown (4 of 5)]
[im 1/120]
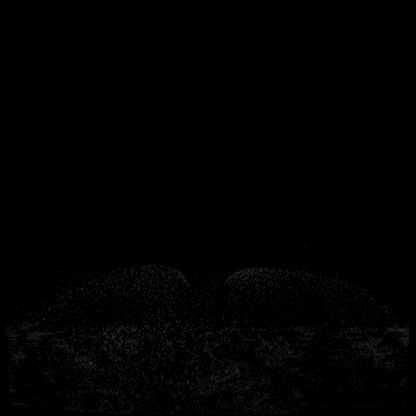
[im 30/120]
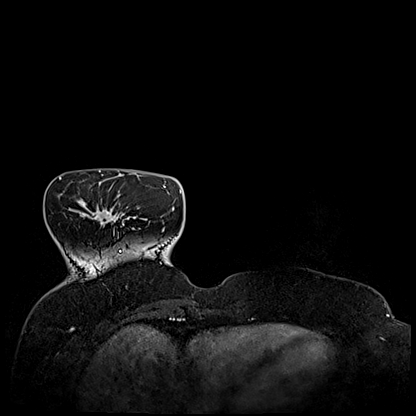
[im 60/120]
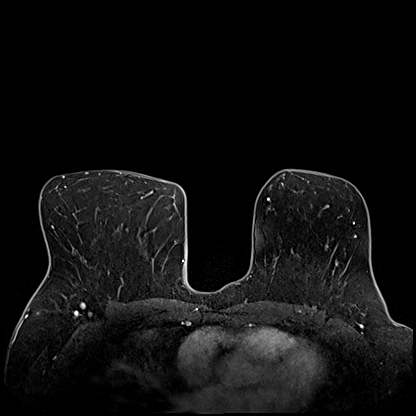
[im 90/120]
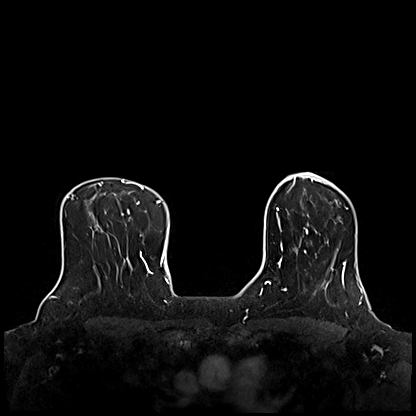
[im 120/120]
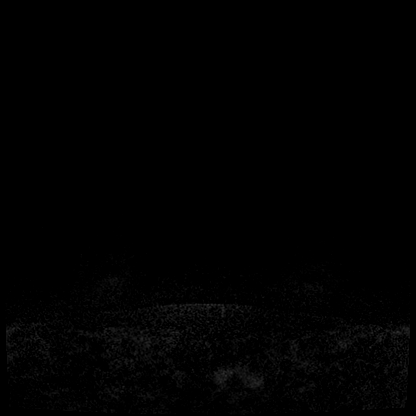

[Series 8: T1 · axial · 1.2mm · 0.87mm/px · z∈[-79,+64]mm · 5 of 120 slices shown]
[im 1/120]
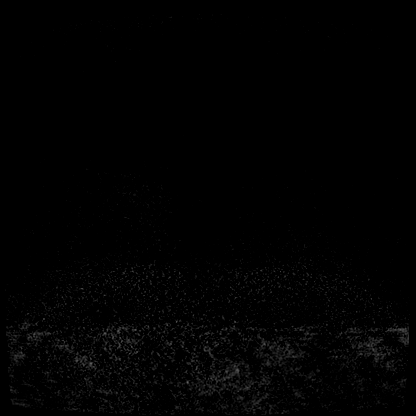
[im 30/120]
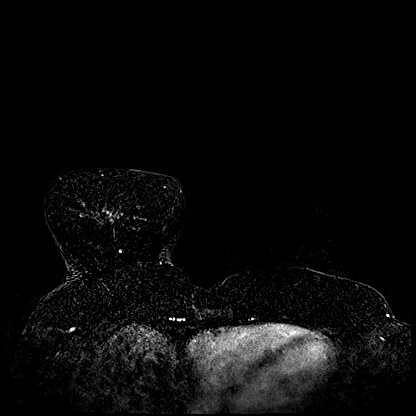
[im 60/120]
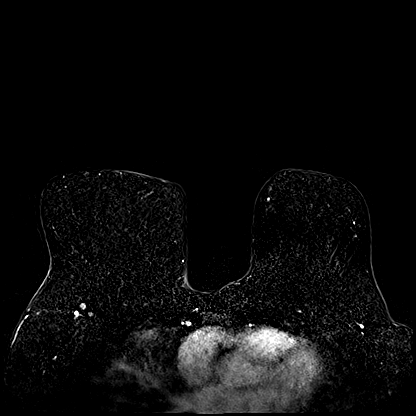
[im 90/120]
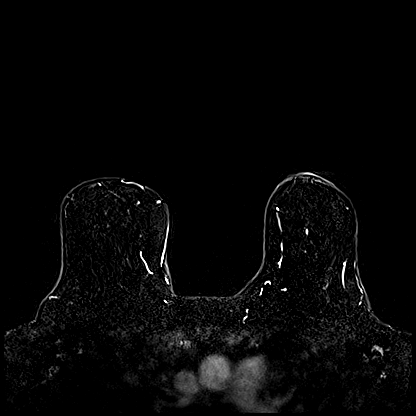
[im 120/120]
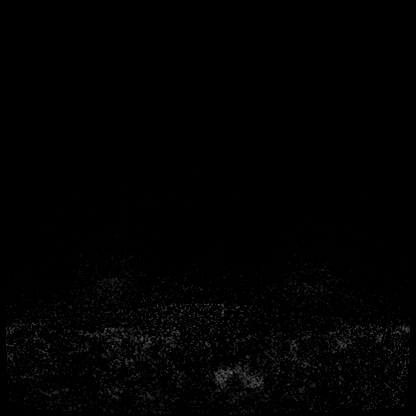

[Series 11: T1 fat-sat · axial · 1.2mm · 0.87mm/px · z∈[-79,-44]mm · 2 of 120 slices shown (5 of 5)]
[im 1/120]
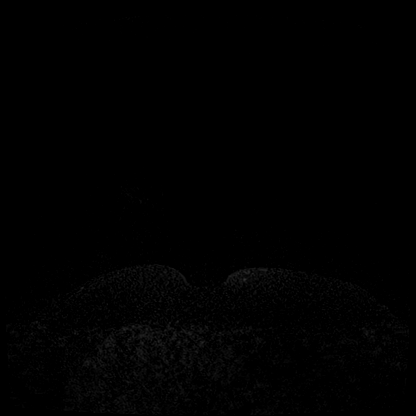
[im 30/120]
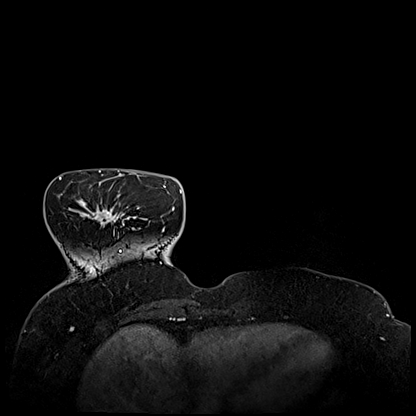

[30 of 48 positions shown; findings below may reference images not displayed]

Three-dimensional MR images were rendered by post-processing of the
original MR data on an independent workstation. The
three-dimensional MR images were interpreted, and findings are
reported in the following complete MRI report for this study. Three
dimensional images were evaluated at the independent [employer]
workstation
FINDINGS: Breast composition: a. Almost entirely fat.

Background parenchymal enhancement: Minimal.

Right breast: Postsurgical changes are noted in the inferior and
central right breast. No suspicious mass or abnormal enhancement.

Left breast: No suspicious mass or abnormal enhancement.

Lymph nodes: No abnormal appearing lymph nodes.

Ancillary findings:  None.
IMPRESSION: 1. No MRI evidence of malignancy in either breast.
2. Right breast postsurgical changes.

RECOMMENDATION:
Routine annual screening with mammography and breast MRI. The
patient is due for her next bilateral screening mammogram in [REDACTED]

BI-RADS CATEGORY  2: Benign.

## 2019-10-16 MED ORDER — GADOBUTROL 1 MMOL/ML IV SOLN
10.0000 mL | Freq: Once | INTRAVENOUS | Status: AC | PRN
  Administered 2019-10-16: 10 mL via INTRAVENOUS

## 2019-10-22 ENCOUNTER — Ambulatory Visit: Payer: Self-pay | Admitting: Surgery

## 2019-10-22 NOTE — H&P (Signed)
Christina Baldwin is an 44 y.o. female.   Chief Complaint: high-risk breast cancer HPI: patient presents for bilateral simple mastectomies with reconstruction due to (RISK OF BREAST CANCER 25% AND ABNORMALITY of genetic screening.  Past Medical History:  Diagnosis Date  . ADD (attention deficit disorder)   . Family history of breast cancer   . Family history of cervical cancer   . Family history of lung cancer   . Hypertension   . Mixed hyperlipidemia   . Pre-diabetes     Past Surgical History:  Procedure Laterality Date  . BREAST LUMPECTOMY WITH RADIOACTIVE SEED LOCALIZATION Right 06/20/2019   Procedure: RIGHT BREAST LUMPECTOMY x2 WITH RADIOACTIVE SEED LOCALIZATION;  Surgeon: Erroll Luna, MD;  Location: [place];  Service: General;  Laterality: Right;  . ENDOMETRIAL ABLATION    . HAND SURGERY    . TUMOR REMOVAL      Family History  Problem Relation Age of Onset  . Breast cancer Maternal Aunt        dx early 7s  . Leukemia Maternal Grandfather   . Lung cancer Paternal Grandmother   . Breast cancer Other   . Cervical cancer Cousin   . Lung cancer Cousin   . Breast cancer Maternal Great-grandmother   . Breast cancer Cousin        dx 53s   Social History:  reports that she has never smoked. She has never used smokeless tobacco. She reports current alcohol use. She reports that she does not use drugs.  Allergies:  Allergies  Allergen Reactions  . Sulfa Antibiotics     Bactrim    (Not in a hospital admission)   No results found for this or any previous visit (from the past 48 hour(s)). No results found.  Review of Systems  All other systems reviewed and are negative.   There were no vitals taken for this visit. Physical Exam  Constitutional: She is oriented to person, place, and time. She appears well-developed and well-nourished.  HENT:  Head: Normocephalic and atraumatic.  Eyes: Pupils are equal, round, and reactive to light. EOM are  normal.  Cardiovascular: Normal rate.  Respiratory: Effort normal.  RIGHT  BREAST SCAR  NO OTHER MASSES   GI: Soft.  Musculoskeletal:        General: Normal range of motion.     Cervical back: Normal range of motion.  Neurological: She is alert and oriented to person, place, and time.  Skin: Skin is warm and dry.     Assessment/Plan High risk breast cancer PATIENT WITH ABNORMAL GENETIC SCREENING Discuss urinary magnetic resonance imaging versus risk reducing surgery.  She has seen plastic surgery has opted for risk reduction surgery.  Her over 25% risk of breast cancer which is felt to be a low estrogen for her given her genetic testing.The surgical and non surgical options have been discussed with the patient.  Risks of surgery include bleeding,  Infection,  Flap necrosis,  Tissue loss,  Chronic pain, death, Numbness,  And the need for additional procedures.  Reconstruction options also have been discussed with the patient as well.  The patient agrees to proceed.  Turner Daniels, MD 10/22/2019, 2:32 PM

## 2019-11-14 NOTE — H&P (Signed)
  Subjective:     Patient ID: Christina Baldwin is a 44 y.o. female.  HPI  Here for follow up discussion breast reconstruction prior to planned risk reducing mastectomies. First MMG 2020 which showed 2 areas of concern in the right breast upper quadrant. 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 Family history breast ca in maternal aunt and cousin. Met with Dr. Lindi Adie who counseled approximately 24% lifetime risk of breast cancer by the [facility] model. Given likely pathogenic genetic mutation as below, risk felt to be overall higher than this. Yearly MRI/MMG recommended.   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  MRI 09/2019 normal  Current D/DD. Wt stable.  PMH significant for pre diabetes on metformin, last HbA1c 5.4 03/2019. History seizure d/o- last over 10 years ago, no current medication.  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 Lives with 29 yo son. Has aunt in [place] that would assist with post op care.  Review of Systems     Objective:   Physical Exam  Constitutional: She is oriented to person, place, and time.  Cardiovascular: Normal rate, regular rhythm and normal heart sounds.  Pulmonary/Chest: Effort normal and breath sounds normal.  Abdominal: Soft.  Multiple striae, small volume for bilateral reconstruction, no hernias  Neurological: She is alert and oriented to person, place, and time.  Skin:  Fitzpatrick 2    Grade 3 ptosis bilateral, no masses, right> left volume SN to nipple R 29 L 27 cm BW R 18.5 L 16 cm Nipple to IMF R 12 L 11 cm    Assessment:     High risk breast cancer Family history breast cancer    Plan:     Plan bilateral skin reduction mastectomies with immediate expander acellular dermis reconstruction.  Reviewed anchor type scar, resection NAC with this.  Discussed use of acellular dermis in reconstruction, cadaveric source, incorporation over several weeks, risk that if has seroma or infection can act as additional nidus for infection if not incorporated.Reviewed this is an off label use of acellular dermis.  Reviewedprepectoral vs sub pectoral reconstruction. Discussed with patient and benefit of this is no animation deformity, may be less pain. Risk may be more visible rippling over upper poles, greater need of ADM. Reviewed pre pectoral would require larger amount acellular dermis, more drains. Discussed any type reconstruction also risks long term displacement implant and visible rippling. If prepectoral counseled I would recommend she be comfortable with silicone implants as more options that have less rippling. Sheagrees to prepectoral placement.  Additional risks including but not limited to bleeding, hematoma, seroma, damage to adjacent structures, unacceptable cosmetic result, blood clots in legs or lungs, asymmetry, infection requiring removal expanders and ADM reviewed.  Discussed risk COVID infectionthrough this elective surgery. Patient will receive COVID testing prior to surgery. Discussed even if patient receivesa negative test result, the tests in some cases may fail to detect the virus or patient maycontract COVID after the test.COVID 19 infectionbefore/during/aftersurgery may result in lead to a higher chance of complication and death. Planning to receive vaccine following surgery.  Rx for ibuprofen 800 mg, Robaxin, doxycycline, oxycodone given. Rx for [place] [facility] [place] given.

## 2019-11-20 ENCOUNTER — Encounter (HOSPITAL_BASED_OUTPATIENT_CLINIC_OR_DEPARTMENT_OTHER): Payer: Self-pay | Admitting: Surgery

## 2019-11-20 ENCOUNTER — Other Ambulatory Visit: Payer: Self-pay

## 2019-11-22 ENCOUNTER — Encounter (HOSPITAL_BASED_OUTPATIENT_CLINIC_OR_DEPARTMENT_OTHER)
Admission: RE | Admit: 2019-11-22 | Discharge: 2019-11-22 | Disposition: A | Discharge status: Home / Self Care | Payer: Managed Care, Other (non HMO) | Source: Ambulatory Visit | Attending: Surgery | Admitting: Surgery

## 2019-11-22 DIAGNOSIS — Z01812 Encounter for preprocedural laboratory examination: Secondary | ICD-10-CM | POA: Diagnosis present

## 2019-11-22 LAB — CBC WITH DIFFERENTIAL/PLATELET
Abs Immature Granulocytes: 0.04 10*3/uL (ref 0.00–0.07)
Basophils Absolute: 0.1 10*3/uL (ref 0.0–0.1)
Basophils Relative: 1 %
Eosinophils Absolute: 0.3 10*3/uL (ref 0.0–0.5)
Eosinophils Relative: 4 %
HCT: 38.5 % (ref 36.0–46.0)
Hemoglobin: 12.1 g/dL (ref 12.0–15.0)
Immature Granulocytes: 0 %
Lymphocytes Relative: 26 %
Lymphs Abs: 2.5 10*3/uL (ref 0.7–4.0)
MCH: 29.4 pg (ref 26.0–34.0)
MCHC: 31.4 g/dL (ref 30.0–36.0)
MCV: 93.4 fL (ref 80.0–100.0)
Monocytes Absolute: 0.7 10*3/uL (ref 0.1–1.0)
Monocytes Relative: 7 %
Neutro Abs: 5.9 10*3/uL (ref 1.7–7.7)
Neutrophils Relative %: 62 %
Platelets: 310 10*3/uL (ref 150–400)
RBC: 4.12 MIL/uL (ref 3.87–5.11)
RDW: 13.1 % (ref 11.5–15.5)
WBC: 9.5 10*3/uL (ref 4.0–10.5)
nRBC: 0 % (ref 0.0–0.2)

## 2019-11-22 LAB — COMPREHENSIVE METABOLIC PANEL
ALT: 20 U/L (ref 0–44)
AST: 17 U/L (ref 15–41)
Albumin: 4 g/dL (ref 3.5–5.0)
Alkaline Phosphatase: 26 U/L — ABNORMAL LOW (ref 38–126)
Anion gap: 8 (ref 5–15)
BUN: 14 mg/dL (ref 6–20)
CO2: 25 mmol/L (ref 22–32)
Calcium: 9.3 mg/dL (ref 8.9–10.3)
Chloride: 101 mmol/L (ref 98–111)
Creatinine, Ser: 0.94 mg/dL (ref 0.44–1.00)
GFR calc Af Amer: >60 mL/min (ref >60)
GFR calc non Af Amer: >60 mL/min (ref >60)
Glucose, Bld: 117 mg/dL — ABNORMAL HIGH (ref 70–99)
Potassium: 4.5 mmol/L (ref 3.5–5.1)
Sodium: 134 mmol/L — ABNORMAL LOW (ref 135–145)
Total Bilirubin: 0.4 mg/dL (ref 0.3–1.2)
Total Protein: 6.7 g/dL (ref 6.5–8.1)

## 2019-11-22 NOTE — Progress Notes (Signed)

## 2019-11-23 ENCOUNTER — Other Ambulatory Visit (HOSPITAL_COMMUNITY)
Admission: RE | Admit: 2019-11-23 | Discharge: 2019-11-23 | Disposition: A | Discharge status: Home / Self Care | Payer: Managed Care, Other (non HMO) | Source: Ambulatory Visit | Attending: Surgery | Admitting: Surgery

## 2019-11-23 DIAGNOSIS — Z20822 Contact with and (suspected) exposure to covid-19: Secondary | ICD-10-CM | POA: Insufficient documentation

## 2019-11-23 DIAGNOSIS — Z01812 Encounter for preprocedural laboratory examination: Secondary | ICD-10-CM | POA: Diagnosis present

## 2019-11-24 LAB — SARS CORONAVIRUS 2 (TAT 6-24 HRS): SARS Coronavirus 2: NEGATIVE

## 2019-11-27 ENCOUNTER — Encounter (HOSPITAL_BASED_OUTPATIENT_CLINIC_OR_DEPARTMENT_OTHER)
Admission: RE | Disposition: A | Discharge status: Home / Self Care | Payer: Self-pay | Source: Home / Self Care | Attending: Plastic Surgery

## 2019-11-27 ENCOUNTER — Ambulatory Visit (HOSPITAL_BASED_OUTPATIENT_CLINIC_OR_DEPARTMENT_OTHER): Payer: Managed Care, Other (non HMO) | Admitting: Anesthesiology

## 2019-11-27 ENCOUNTER — Ambulatory Visit (HOSPITAL_BASED_OUTPATIENT_CLINIC_OR_DEPARTMENT_OTHER)
Admission: RE | Admit: 2019-11-27 | Discharge: 2019-11-28 | Disposition: A | Discharge status: Home / Self Care | Payer: Managed Care, Other (non HMO) | Attending: Plastic Surgery | Admitting: Plastic Surgery

## 2019-11-27 ENCOUNTER — Encounter (HOSPITAL_BASED_OUTPATIENT_CLINIC_OR_DEPARTMENT_OTHER): Payer: Self-pay | Admitting: Surgery

## 2019-11-27 ENCOUNTER — Other Ambulatory Visit: Payer: Self-pay

## 2019-11-27 DIAGNOSIS — Z1501 Genetic susceptibility to malignant neoplasm of breast: Secondary | ICD-10-CM | POA: Diagnosis present

## 2019-11-27 DIAGNOSIS — E119 Type 2 diabetes mellitus without complications: Secondary | ICD-10-CM | POA: Insufficient documentation

## 2019-11-27 DIAGNOSIS — Z882 Allergy status to sulfonamides status: Secondary | ICD-10-CM | POA: Diagnosis not present

## 2019-11-27 DIAGNOSIS — Z8049 Family history of malignant neoplasm of other genital organs: Secondary | ICD-10-CM | POA: Diagnosis not present

## 2019-11-27 DIAGNOSIS — Z801 Family history of malignant neoplasm of trachea, bronchus and lung: Secondary | ICD-10-CM | POA: Diagnosis not present

## 2019-11-27 DIAGNOSIS — Z803 Family history of malignant neoplasm of breast: Secondary | ICD-10-CM | POA: Diagnosis not present

## 2019-11-27 DIAGNOSIS — I10 Essential (primary) hypertension: Secondary | ICD-10-CM | POA: Insufficient documentation

## 2019-11-27 DIAGNOSIS — E782 Mixed hyperlipidemia: Secondary | ICD-10-CM | POA: Diagnosis not present

## 2019-11-27 HISTORY — PX: BREAST RECONSTRUCTION WITH PLACEMENT OF TISSUE EXPANDER AND ALLODERM: SHX6805

## 2019-11-27 HISTORY — PX: SIMPLE MASTECTOMY WITH AXILLARY SENTINEL NODE BIOPSY: SHX6098

## 2019-11-27 LAB — POCT PREGNANCY, URINE: Preg Test, Ur: NEGATIVE

## 2019-11-27 LAB — GLUCOSE, CAPILLARY
Glucose-Capillary: 83 mg/dL (ref 70–99)
Glucose-Capillary: 130 mg/dL — ABNORMAL HIGH (ref 70–99)
Glucose-Capillary: 150 mg/dL — ABNORMAL HIGH (ref 70–99)

## 2019-11-27 SURGERY — SIMPLE MASTECTOMY
Anesthesia: General | Site: Breast | Laterality: Bilateral

## 2019-11-27 MED ORDER — 0.9 % SODIUM CHLORIDE (POUR BTL) OPTIME
TOPICAL | Status: DC | PRN
  Administered 2019-11-27: 500 mL

## 2019-11-27 MED ORDER — LIDOCAINE 2% (20 MG/ML) 5 ML SYRINGE
INTRAMUSCULAR | Status: AC
  Filled 2019-11-27: qty 5

## 2019-11-27 MED ORDER — MIDAZOLAM HCL 2 MG/2ML IJ SOLN
1.0000 mg | INTRAMUSCULAR | Status: DC | PRN
  Administered 2019-11-27 (×2): 2 mg via INTRAVENOUS

## 2019-11-27 MED ORDER — SUMATRIPTAN SUCCINATE 50 MG PO TABS
50.0000 mg | ORAL_TABLET | ORAL | Status: DC | PRN
  Filled 2019-11-27: qty 1

## 2019-11-27 MED ORDER — ONDANSETRON HCL 4 MG/2ML IJ SOLN: 4.0000 mg | Freq: Four times a day (QID) | INTRAMUSCULAR | Status: DC | PRN

## 2019-11-27 MED ORDER — METHOCARBAMOL 1000 MG/10ML IJ SOLN
INTRAMUSCULAR | Status: AC
  Filled 2019-11-27: qty 10

## 2019-11-27 MED ORDER — FENTANYL CITRATE (PF) 100 MCG/2ML IJ SOLN
INTRAMUSCULAR | Status: AC
  Filled 2019-11-27: qty 2

## 2019-11-27 MED ORDER — OXYCODONE HCL 5 MG PO TABS: 5.0000 mg | ORAL_TABLET | Freq: Once | ORAL | Status: DC | PRN

## 2019-11-27 MED ORDER — METFORMIN HCL 500 MG PO TABS
500.0000 mg | ORAL_TABLET | Freq: Every day | ORAL | Status: DC
  Administered 2019-11-28: 500 mg via ORAL
  Filled 2019-11-27 (×2): qty 1

## 2019-11-27 MED ORDER — POVIDONE-IODINE 10 % EX SOLN
CUTANEOUS | Status: DC | PRN
  Administered 2019-11-27: 1 via TOPICAL

## 2019-11-27 MED ORDER — EPHEDRINE SULFATE 50 MG/ML IJ SOLN
INTRAMUSCULAR | Status: DC | PRN
  Administered 2019-11-27: 10 mg via INTRAVENOUS

## 2019-11-27 MED ORDER — ROCURONIUM BROMIDE 10 MG/ML (PF) SYRINGE
PREFILLED_SYRINGE | INTRAVENOUS | Status: AC
  Filled 2019-11-27: qty 30

## 2019-11-27 MED ORDER — FENTANYL CITRATE (PF) 100 MCG/2ML IJ SOLN
50.0000 ug | INTRAMUSCULAR | Status: AC | PRN
  Administered 2019-11-27: 50 ug via INTRAVENOUS
  Administered 2019-11-27: 100 ug via INTRAVENOUS
  Administered 2019-11-27 (×2): 50 ug via INTRAVENOUS
  Administered 2019-11-27: 25 ug via INTRAVENOUS
  Administered 2019-11-27 (×2): 50 ug via INTRAVENOUS
  Administered 2019-11-27: 25 ug via INTRAVENOUS
  Administered 2019-11-27 (×2): 50 ug via INTRAVENOUS

## 2019-11-27 MED ORDER — PROMETHAZINE HCL 25 MG/ML IJ SOLN
INTRAMUSCULAR | Status: AC
  Filled 2019-11-27: qty 1

## 2019-11-27 MED ORDER — SODIUM CHLORIDE 0.45 % IV SOLN: INTRAVENOUS | Status: DC

## 2019-11-27 MED ORDER — ONDANSETRON HCL 4 MG/2ML IJ SOLN
INTRAMUSCULAR | Status: AC
  Filled 2019-11-27: qty 2

## 2019-11-27 MED ORDER — ROCURONIUM BROMIDE 100 MG/10ML IV SOLN
INTRAVENOUS | Status: DC | PRN
  Administered 2019-11-27: 50 mg via INTRAVENOUS
  Administered 2019-11-27: 20 mg via INTRAVENOUS
  Administered 2019-11-27 (×3): 10 mg via INTRAVENOUS

## 2019-11-27 MED ORDER — LISINOPRIL 10 MG PO TABS
10.0000 mg | ORAL_TABLET | Freq: Every day | ORAL | Status: DC
  Administered 2019-11-27: 21:00:00 10 mg via ORAL
  Filled 2019-11-27 (×2): qty 1

## 2019-11-27 MED ORDER — CEFAZOLIN SODIUM-DEXTROSE 2-4 GM/100ML-% IV SOLN
2.0000 g | INTRAVENOUS | Status: AC
  Administered 2019-11-27: 2 g via INTRAVENOUS

## 2019-11-27 MED ORDER — ONDANSETRON 4 MG PO TBDP: 4.0000 mg | ORAL_TABLET | Freq: Four times a day (QID) | ORAL | Status: DC | PRN

## 2019-11-27 MED ORDER — PROMETHAZINE HCL 25 MG/ML IJ SOLN
6.2500 mg | Freq: Once | INTRAMUSCULAR | Status: AC
  Administered 2019-11-27: 6.25 mg via INTRAVENOUS

## 2019-11-27 MED ORDER — PROPOFOL 10 MG/ML IV BOLUS
INTRAVENOUS | Status: DC | PRN
  Administered 2019-11-27: 150 mg via INTRAVENOUS

## 2019-11-27 MED ORDER — KETOROLAC TROMETHAMINE 30 MG/ML IJ SOLN
30.0000 mg | Freq: Three times a day (TID) | INTRAMUSCULAR | Status: DC
  Administered 2019-11-27 – 2019-11-28 (×2): 30 mg via INTRAVENOUS
  Filled 2019-11-27: qty 1

## 2019-11-27 MED ORDER — PHENYLEPHRINE 40 MCG/ML (10ML) SYRINGE FOR IV PUSH (FOR BLOOD PRESSURE SUPPORT)
PREFILLED_SYRINGE | INTRAVENOUS | Status: DC | PRN
  Administered 2019-11-27: 80 ug via INTRAVENOUS
  Administered 2019-11-27: 40 ug via INTRAVENOUS

## 2019-11-27 MED ORDER — FENTANYL CITRATE (PF) 100 MCG/2ML IJ SOLN
25.0000 ug | INTRAMUSCULAR | Status: DC | PRN
  Administered 2019-11-27 (×2): 50 ug via INTRAVENOUS

## 2019-11-27 MED ORDER — INSULIN ASPART 100 UNIT/ML ~~LOC~~ SOLN: 0.0000 [IU] | Freq: Three times a day (TID) | SUBCUTANEOUS | Status: DC

## 2019-11-27 MED ORDER — LIDOCAINE HCL (CARDIAC) PF 100 MG/5ML IV SOSY
PREFILLED_SYRINGE | INTRAVENOUS | Status: DC | PRN
  Administered 2019-11-27: 10 mg via INTRAVENOUS

## 2019-11-27 MED ORDER — PROPOFOL 10 MG/ML IV BOLUS
INTRAVENOUS | Status: AC
  Filled 2019-11-27: qty 20

## 2019-11-27 MED ORDER — OXYCODONE HCL 5 MG PO TABS
5.0000 mg | ORAL_TABLET | ORAL | Status: DC | PRN
  Administered 2019-11-27: 20:00:00 10 mg via ORAL
  Administered 2019-11-28: 08:00:00 5 mg via ORAL
  Filled 2019-11-27: qty 2
  Filled 2019-11-27: qty 1

## 2019-11-27 MED ORDER — SUGAMMADEX SODIUM 200 MG/2ML IV SOLN
INTRAVENOUS | Status: DC | PRN
  Administered 2019-11-27: 200 mg via INTRAVENOUS

## 2019-11-27 MED ORDER — DEXAMETHASONE SODIUM PHOSPHATE 10 MG/ML IJ SOLN
INTRAMUSCULAR | Status: AC
  Filled 2019-11-27: qty 1

## 2019-11-27 MED ORDER — BUPIVACAINE HCL (PF) 0.25 % IJ SOLN
INTRAMUSCULAR | Status: DC | PRN
  Administered 2019-11-27 (×2): 30 mL

## 2019-11-27 MED ORDER — KETOROLAC TROMETHAMINE 30 MG/ML IJ SOLN
INTRAMUSCULAR | Status: AC
  Filled 2019-11-27: qty 1

## 2019-11-27 MED ORDER — DEXAMETHASONE SODIUM PHOSPHATE 4 MG/ML IJ SOLN
INTRAMUSCULAR | Status: DC | PRN
  Administered 2019-11-27: 5 mg via INTRAVENOUS

## 2019-11-27 MED ORDER — CHLORHEXIDINE GLUCONATE CLOTH 2 % EX PADS: 6.0000 | MEDICATED_PAD | Freq: Once | CUTANEOUS | Status: DC

## 2019-11-27 MED ORDER — ONDANSETRON HCL 4 MG/2ML IJ SOLN: 4.0000 mg | Freq: Once | INTRAMUSCULAR | Status: DC | PRN

## 2019-11-27 MED ORDER — MIDAZOLAM HCL 2 MG/2ML IJ SOLN
INTRAMUSCULAR | Status: AC
  Filled 2019-11-27: qty 2

## 2019-11-27 MED ORDER — METHOCARBAMOL 500 MG PO TABS: 750.0000 mg | ORAL_TABLET | Freq: Four times a day (QID) | ORAL | Status: DC

## 2019-11-27 MED ORDER — SODIUM CHLORIDE 0.9 % IV SOLN
INTRAVENOUS | Status: DC | PRN
  Administered 2019-11-27: 1000 mL

## 2019-11-27 MED ORDER — ROSUVASTATIN CALCIUM 20 MG PO TABS
20.0000 mg | ORAL_TABLET | Freq: Every day | ORAL | Status: DC
  Administered 2019-11-27: 20 mg via ORAL
  Filled 2019-11-27 (×2): qty 1

## 2019-11-27 MED ORDER — CEFAZOLIN SODIUM-DEXTROSE 1-4 GM/50ML-% IV SOLN
1.0000 g | Freq: Three times a day (TID) | INTRAVENOUS | Status: AC
  Administered 2019-11-27 – 2019-11-28 (×2): 1 g via INTRAVENOUS
  Filled 2019-11-27 (×2): qty 50

## 2019-11-27 MED ORDER — TRAZODONE HCL 50 MG PO TABS
50.0000 mg | ORAL_TABLET | Freq: Every day | ORAL | Status: DC
  Administered 2019-11-27: 22:00:00 50 mg via ORAL
  Filled 2019-11-27 (×2): qty 1

## 2019-11-27 MED ORDER — ONDANSETRON HCL 4 MG/2ML IJ SOLN
INTRAMUSCULAR | Status: DC | PRN
  Administered 2019-11-27: 4 mg via INTRAVENOUS

## 2019-11-27 MED ORDER — ROCURONIUM BROMIDE 10 MG/ML (PF) SYRINGE
PREFILLED_SYRINGE | INTRAVENOUS | Status: AC
  Filled 2019-11-27: qty 10

## 2019-11-27 MED ORDER — OXYCODONE HCL 5 MG/5ML PO SOLN: 5.0000 mg | Freq: Once | ORAL | Status: DC | PRN

## 2019-11-27 MED ORDER — METHOCARBAMOL 1000 MG/10ML IJ SOLN
500.0000 mg | Freq: Once | INTRAVENOUS | Status: AC
  Administered 2019-11-27: 500 mg via INTRAVENOUS
  Filled 2019-11-27: qty 5

## 2019-11-27 MED ORDER — HYDROMORPHONE HCL 1 MG/ML IJ SOLN: 0.5000 mg | INTRAMUSCULAR | Status: DC | PRN

## 2019-11-27 MED ORDER — CEFAZOLIN SODIUM-DEXTROSE 2-4 GM/100ML-% IV SOLN
INTRAVENOUS | Status: AC
  Filled 2019-11-27: qty 100

## 2019-11-27 MED ORDER — EPHEDRINE 5 MG/ML INJ
INTRAVENOUS | Status: AC
  Filled 2019-11-27: qty 10

## 2019-11-27 MED ORDER — ENOXAPARIN SODIUM 40 MG/0.4ML ~~LOC~~ SOLN
40.0000 mg | SUBCUTANEOUS | Status: DC
  Administered 2019-11-28: 40 mg via SUBCUTANEOUS
  Filled 2019-11-27: qty 0.4

## 2019-11-27 MED ORDER — LACTATED RINGERS IV SOLN: INTRAVENOUS | Status: DC

## 2019-11-27 MED ORDER — CLONIDINE HCL 0.1 MG PO TABS
0.1000 mg | ORAL_TABLET | Freq: Two times a day (BID) | ORAL | Status: DC
  Filled 2019-11-27: qty 1

## 2019-11-27 SURGICAL SUPPLY — 95 items
ALLOGRAFT PERF 16X20 1.6+/-0.4 (Tissue) ×2 IMPLANT
APPLIER CLIP 11 MED OPEN (CLIP) ×2
APPLIER CLIP 9.375 MED OPEN (MISCELLANEOUS)
BAG DECANTER FOR FLEXI CONT (MISCELLANEOUS) ×2 IMPLANT
BENZOIN TINCTURE PRP APPL 2/3 (GAUZE/BANDAGES/DRESSINGS) IMPLANT
BINDER BREAST LRG (GAUZE/BANDAGES/DRESSINGS) IMPLANT
BINDER BREAST MEDIUM (GAUZE/BANDAGES/DRESSINGS) IMPLANT
BINDER BREAST XLRG (GAUZE/BANDAGES/DRESSINGS) ×1 IMPLANT
BINDER BREAST XXLRG (GAUZE/BANDAGES/DRESSINGS) IMPLANT
BLADE HEX COATED 2.75 (ELECTRODE) ×2 IMPLANT
BLADE SURG 10 STRL SS (BLADE) ×4 IMPLANT
BLADE SURG 15 STRL LF DISP TIS (BLADE) ×1 IMPLANT
BLADE SURG 15 STRL SS (BLADE) ×1
BNDG ELASTIC 6X5.8 VLCR STR LF (GAUZE/BANDAGES/DRESSINGS) ×1 IMPLANT
BNDG GAUZE ELAST 4 BULKY (GAUZE/BANDAGES/DRESSINGS) IMPLANT
CANISTER SUCT 1200ML W/VALVE (MISCELLANEOUS) ×2 IMPLANT
CHLORAPREP W/TINT 26 (MISCELLANEOUS) ×3 IMPLANT
CLIP APPLIE 11 MED OPEN (CLIP) IMPLANT
CLIP APPLIE 9.375 MED OPEN (MISCELLANEOUS) IMPLANT
COUNTER NEEDLE 1200 MAGNETIC (NEEDLE) IMPLANT
COVER BACK TABLE 60X90IN (DRAPES) ×2 IMPLANT
COVER MAYO STAND STRL (DRAPES) ×4 IMPLANT
COVER PROBE W GEL 5X96 (DRAPES) ×2 IMPLANT
COVER WAND RF STERILE (DRAPES) IMPLANT
DECANTER SPIKE VIAL GLASS SM (MISCELLANEOUS) IMPLANT
DERMABOND ADVANCED (GAUZE/BANDAGES/DRESSINGS) ×2
DERMABOND ADVANCED .7 DNX12 (GAUZE/BANDAGES/DRESSINGS) ×2 IMPLANT
DRAIN CHANNEL 15F RND FF W/TCR (WOUND CARE) ×2 IMPLANT
DRAIN CHANNEL 19F RND (DRAIN) ×3 IMPLANT
DRAPE INCISE IOBAN 66X45 STRL (DRAPES) ×1 IMPLANT
DRAPE LAPAROSCOPIC ABDOMINAL (DRAPES) ×1 IMPLANT
DRAPE TOP ARMCOVERS (MISCELLANEOUS) ×2 IMPLANT
DRAPE U-SHAPE 76X120 STRL (DRAPES) ×3 IMPLANT
DRAPE UTILITY XL STRL (DRAPES) ×2 IMPLANT
DRSG PAD ABDOMINAL 8X10 ST (GAUZE/BANDAGES/DRESSINGS) ×4 IMPLANT
DRSG TEGADERM 4X10 (GAUZE/BANDAGES/DRESSINGS) ×4 IMPLANT
ELECT BLADE 4.0 EZ CLEAN MEGAD (MISCELLANEOUS) ×2
ELECT COATED BLADE 2.86 ST (ELECTRODE) ×2 IMPLANT
ELECT REM PT RETURN 9FT ADLT (ELECTROSURGICAL) ×2
ELECTRODE BLDE 4.0 EZ CLN MEGD (MISCELLANEOUS) ×1 IMPLANT
ELECTRODE REM PT RTRN 9FT ADLT (ELECTROSURGICAL) ×1 IMPLANT
EVACUATOR SILICONE 100CC (DRAIN) ×2 IMPLANT
EXPANDER TISSUE FV FOURTE 400 (Prosthesis & Implant Plastic) IMPLANT
GAUZE SPONGE 4X4 12PLY STRL LF (GAUZE/BANDAGES/DRESSINGS) IMPLANT
GLOVE BIO SURGEON STRL SZ 6 (GLOVE) ×4 IMPLANT
GLOVE BIOGEL PI IND STRL 8 (GLOVE) ×1 IMPLANT
GLOVE BIOGEL PI INDICATOR 8 (GLOVE) ×1
GLOVE ECLIPSE 8.0 STRL XLNG CF (GLOVE) ×2 IMPLANT
GOWN STRL REUS W/ TWL LRG LVL3 (GOWN DISPOSABLE) ×4 IMPLANT
GOWN STRL REUS W/TWL LRG LVL3 (GOWN DISPOSABLE) ×4
KIT FILL SYSTEM UNIVERSAL (SET/KITS/TRAYS/PACK) IMPLANT
MARKER SKIN DUAL TIP RULER LAB (MISCELLANEOUS) IMPLANT
NDL HYPO 25X1 1.5 SAFETY (NEEDLE) ×2 IMPLANT
NDL SAFETY ECLIPSE 18X1.5 (NEEDLE) ×1 IMPLANT
NEEDLE HYPO 18GX1.5 SHARP (NEEDLE)
NEEDLE HYPO 25X1 1.5 SAFETY (NEEDLE) IMPLANT
NS IRRIG 1000ML POUR BTL (IV SOLUTION) ×2 IMPLANT
PENCIL SMOKE EVACUATOR (MISCELLANEOUS) ×2 IMPLANT
PIN SAFETY STERILE (MISCELLANEOUS) ×2 IMPLANT
PUNCH BIOPSY DERMAL 4MM (MISCELLANEOUS) IMPLANT
SET BASIN DAY SURGERY F.S. (CUSTOM PROCEDURE TRAY) ×2 IMPLANT
SHEET MEDIUM DRAPE 40X70 STRL (DRAPES) ×3 IMPLANT
SLEEVE SCD COMPRESS KNEE MED (MISCELLANEOUS) ×2 IMPLANT
SPONGE LAP 18X18 RF (DISPOSABLE) ×4 IMPLANT
SPONGE LAP 4X18 RFD (DISPOSABLE) IMPLANT
STAPLER VISISTAT 35W (STAPLE) ×2 IMPLANT
STRIP CLOSURE SKIN 1/2X4 (GAUZE/BANDAGES/DRESSINGS) IMPLANT
SUT CHROMIC 3 0 SH 27 (SUTURE) IMPLANT
SUT CHROMIC 4 0 PS 2 18 (SUTURE) ×7 IMPLANT
SUT ETHILON 2 0 FS 18 (SUTURE) ×2 IMPLANT
SUT MNCRL AB 3-0 PS2 18 (SUTURE) ×2 IMPLANT
SUT MNCRL AB 4-0 PS2 18 (SUTURE) ×3 IMPLANT
SUT MON AB 4-0 PC3 18 (SUTURE) IMPLANT
SUT SILK 2 0 PERMA HAND 18 BK (SUTURE) ×1 IMPLANT
SUT SILK 2 0 SH (SUTURE) ×1 IMPLANT
SUT VIC AB 3-0 54X BRD REEL (SUTURE) ×1 IMPLANT
SUT VIC AB 3-0 BRD 54 (SUTURE) ×1
SUT VIC AB 3-0 SH 27 (SUTURE)
SUT VIC AB 3-0 SH 27X BRD (SUTURE) IMPLANT
SUT VICRYL 0 CT-2 (SUTURE) ×4 IMPLANT
SUT VICRYL 3-0 CR8 SH (SUTURE) ×3 IMPLANT
SUT VICRYL 4-0 PS2 18IN ABS (SUTURE) ×2 IMPLANT
SUT VLOC 180 0 24IN GS25 (SUTURE) ×2 IMPLANT
SYR 50ML LL SCALE MARK (SYRINGE) ×1 IMPLANT
SYR BULB IRRIG 60ML STRL (SYRINGE) ×3 IMPLANT
SYR CONTROL 10ML LL (SYRINGE) ×2 IMPLANT
TAPE MEASURE VINYL STERILE (MISCELLANEOUS) IMPLANT
TISSUE EXPNDR FV FOURTE 400 (Prosthesis & Implant Plastic) ×4 IMPLANT
TOWEL GREEN STERILE FF (TOWEL DISPOSABLE) ×4 IMPLANT
TRAY DSU PREP LF (CUSTOM PROCEDURE TRAY) ×2 IMPLANT
TRAY FAXITRON CT DISP (TRAY / TRAY PROCEDURE) ×1 IMPLANT
TRAY FOLEY W/BAG SLVR 14FR LF (SET/KITS/TRAYS/PACK) ×1 IMPLANT
TUBE CONNECTING 20X1/4 (TUBING) ×2 IMPLANT
UNDERPAD 30X36 HEAVY ABSORB (UNDERPADS AND DIAPERS) ×4 IMPLANT
YANKAUER SUCT BULB TIP NO VENT (SUCTIONS) ×2 IMPLANT

## 2019-11-27 NOTE — Interval H&P Note (Signed)
History and Physical Interval Note:  11/27/2019 11:36 AM  [place]  has presented today for surgery, with the diagnosis of HIGH RISK BREAST CANCER.  The various methods of treatment have been discussed with the patient and family. After consideration of risks, benefits and other options for treatment, the patient has consented to  Procedure(s) with comments: BILATERAL SIMPLE MASTECTOMY (Bilateral) - PEC BLOCK BILATERAL BREAST RECONSTRUCTION WITH PLACEMENT OF TISSUE EXPANDER AND ALLODERM (Bilateral) as a surgical intervention.  The patient's history has been reviewed, patient examined, no change in status, stable for surgery.  I have reviewed the patient's chart and labs.  Questions were answered to the patient's satisfaction.     [place]

## 2019-11-27 NOTE — Interval H&P Note (Signed)
History and Physical Interval Note:  11/27/2019 11:05 AM  [place]  has presented today for surgery, with the diagnosis of HIGH RISK BREAST CANCER.  The various methods of treatment have been discussed with the patient and family. After consideration of risks, benefits and other options for treatment, the patient has consented to  Procedure(s) with comments: BILATERAL SIMPLE MASTECTOMY (Bilateral) - PEC BLOCK BILATERAL BREAST RECONSTRUCTION WITH PLACEMENT OF TISSUE EXPANDER AND ALLODERM (Bilateral) as a surgical intervention.  The patient's history has been reviewed, patient examined, no change in status, stable for surgery.  I have reviewed the patient's chart and labs.  Questions were answered to the patient's satisfaction.     [name] Rihanna Marseille

## 2019-11-27 NOTE — Anesthesia Procedure Notes (Signed)
Anesthesia Regional Block: Pectoralis block   Pre-Anesthetic Checklist: ,, timeout performed, Correct Patient, Correct Site, Correct Laterality, Correct Procedure, Correct Position, site marked, Risks and benefits discussed,  Surgical consent,  Pre-op evaluation,  At surgeon's request and post-op pain management  Laterality: Left  Prep: chloraprep       Needles:  Injection technique: Single-shot  Needle Type: Echogenic Stimulator Needle     Needle Length: 10cm  Needle Gauge: 21     Additional Needles:   Procedures:,,,, ultrasound used (permanent image in chart),,,,  Narrative:  Start time: 11/27/2019 11:38 AM End time: 11/27/2019 11:41 AM Injection made incrementally with aspirations every 5 mL.  Performed by: Personally  Anesthesiologist: Lidia Collum, MD  Additional Notes: Monitors applied. Injection made in 5cc increments. No resistance to injection. Good needle visualization. Patient tolerated procedure well.

## 2019-11-27 NOTE — H&P (Signed)
Chief Complaint: high-risk breast cancer HPI: patient presents for bilateral simple mastectomies with reconstruction due to (RISK OF BREAST CANCER 25% AND ABNORMALITY of genetic screening.      Past Medical History:  Diagnosis Date  . ADD (attention deficit disorder)   . Family history of breast cancer   . Family history of cervical cancer   . Family history of lung cancer   . Hypertension   . Mixed hyperlipidemia   . Pre-diabetes          Past Surgical History:  Procedure Laterality Date  . BREAST LUMPECTOMY WITH RADIOACTIVE SEED LOCALIZATION Right 06/20/2019   Procedure: RIGHT BREAST LUMPECTOMY x2 WITH RADIOACTIVE SEED LOCALIZATION;  Surgeon: Erroll Luna, MD;  Location: [facility];  Service: General;  Laterality: Right;  . ENDOMETRIAL ABLATION    . HAND SURGERY    . TUMOR REMOVAL           Family History  Problem Relation Age of Onset  . Breast cancer Maternal Aunt        dx early 16s  . Leukemia Maternal Grandfather   . Lung cancer Paternal Grandmother   . Breast cancer Other   . Cervical cancer Cousin   . Lung cancer Cousin   . Breast cancer Maternal Great-grandmother   . Breast cancer Cousin        dx 19s   Social History:  reports that she has never smoked. She has never used smokeless tobacco. She reports current alcohol use. She reports that she does not use drugs.  Allergies:       Allergies  Allergen Reactions  . Sulfa Antibiotics     Bactrim    (Not in a hospital admission)   Lab Results Last 48 Hours  No results found for this or any previous visit (from the past 48 hour(s)).   Imaging Results (Last 48 hours)  No results found.    Review of Systems  All other systems reviewed and are negative.   There were no vitals taken for this visit. Physical Exam  Constitutional: She is oriented to person, place, and time. She appears well-developed and well-nourished.  HENT:  Head:  Normocephalic and atraumatic.  Eyes: Pupils are equal, round, and reactive to light. EOM are normal.  Cardiovascular: Normal rate.  Respiratory: Effort normal.  RIGHT  BREAST SCAR  NO OTHER MASSES   GI: Soft.  Musculoskeletal:        General: Normal range of motion.     Cervical back: Normal range of motion.  Neurological: She is alert and oriented to person, place, and time.  Skin: Skin is warm and dry.     Assessment/Plan High risk breast cancer PATIENT WITH ABNORMAL GENETIC SCREENING Discuss ur magnetic resonance imaging versus risk reducing surgery.  She has seen plastic surgery has opted for risk reduction surgery. Plan on simple mastectomy and resection of NAC.    Her over 25% risk of breast cancer which is felt to be a low estimate  for her given her genetic testing.The surgical and non surgical options have been discussed with the patient.  Risks of surgery include bleeding,  Infection,  Flap necrosis,  Tissue loss,  Chronic pain, death, Numbness,  And the need for additional procedures.  Reconstruction options also have been discussed with the patient as well.  The patient agrees to proceed.  Turner Daniels, MD

## 2019-11-27 NOTE — Progress Notes (Signed)
Assisted Dr. Witman with right, left, ultrasound guided, pectoralis block. Side rails up, monitors on throughout procedure. See vital signs in flow sheet. Tolerated Procedure well. 

## 2019-11-27 NOTE — Anesthesia Preprocedure Evaluation (Addendum)
Anesthesia Evaluation  Patient identified by MRN, date of birth, ID band Patient awake    Reviewed: Allergy & Precautions, NPO status , Patient's Chart, lab work & pertinent test results  History of Anesthesia Complications Negative for: history of anesthetic complications  Airway Mallampati: II  TM Distance: >3 FB Neck ROM: Full    Dental  (+) Teeth Intact   Pulmonary neg pulmonary ROS,    Pulmonary exam normal        Cardiovascular hypertension, Pt. on medications Normal cardiovascular exam     Neuro/Psych negative neurological ROS  negative psych ROS   GI/Hepatic negative GI ROS, Neg liver ROS,   Endo/Other  diabetes, Oral Hypoglycemic Agents  Renal/GU negative Renal ROS  negative genitourinary   Musculoskeletal negative musculoskeletal ROS (+)   Abdominal   Peds  Hematology negative hematology ROS (+)   Anesthesia Other Findings  Risk reducing bilateral mastectomies for family history of breast cancer and abnormal genetic screen  HLD  Reproductive/Obstetrics                           Anesthesia Physical Anesthesia Plan  ASA: II  Anesthesia Plan: General   Post-op Pain Management:    Induction: Intravenous  PONV Risk Score and Plan: Ondansetron, Dexamethasone, Treatment may vary due to age or medical condition and Midazolam  Airway Management Planned: Oral ETT  Additional Equipment: None  Intra-op Plan:   Post-operative Plan: Extubation in OR  Informed Consent: I have reviewed the patients History and Physical, chart, labs and discussed the procedure including the risks, benefits and alternatives for the proposed anesthesia with the patient or authorized representative who has indicated his/her understanding and acceptance.     Dental advisory given  Plan Discussed with:   Anesthesia Plan Comments:        Anesthesia Quick Evaluation

## 2019-11-27 NOTE — Anesthesia Procedure Notes (Signed)
Anesthesia Regional Block: Pectoralis block   Pre-Anesthetic Checklist: ,, timeout performed, Correct Patient, Correct Site, Correct Laterality, Correct Procedure, Correct Position, site marked, Risks and benefits discussed,  Surgical consent,  Pre-op evaluation,  At surgeon's request and post-op pain management  Laterality: Right  Prep: chloraprep       Needles:  Injection technique: Single-shot  Needle Type: Echogenic Stimulator Needle     Needle Length: 10cm  Needle Gauge: 21     Additional Needles:   Procedures:,,,, ultrasound used (permanent image in chart),,,,  Narrative:  Start time: 11/27/2019 11:41 AM End time: 11/27/2019 11:44 AM Injection made incrementally with aspirations every 5 mL.  Performed by: Personally  Anesthesiologist: Lidia Collum, MD  Additional Notes: Monitors applied. Injection made in 5cc increments. No resistance to injection. Good needle visualization. Patient tolerated procedure well.

## 2019-11-27 NOTE — Op Note (Signed)
Preoperative diagnosis: High risk for breast cancer   Postoperative diagnosis: same  Procedure: bilateral  simple mastectomy (RISK REDUCTION)   Surgeon: Joyice Faster. Savanna Dooley  MD   Assistant surgeon: none  Anesthesia:General  Clinical History and Indications:The patient is seen in the holding area and we reviewed the plans for the procedure as noted above. We reviewed the risks and complications a final time. She had no further questions. I marked theright side as the operative side.  Patient was deemed to be high risk with a lifetime risk of over 25% based upon models and genetic analysis.  We discussed yearly MRI or biannual MRIs one option versus bilateral risk reducing mastectomy.  She opted for bilateral risk reducing mastectomy with concomitant immediate reconstruction.  She has seen plastic surgery and the plan will be for skin sparing mastectomy with expander placement.The surgical and non surgical options have been discussed with the patient.  Risks of surgery include bleeding,  Infection,  Flap necrosis,  Tissue loss,  Chronic pain, death, Numbness,  And the need for additional procedures.  Reconstruction options also have been discussed with the patient as well.  The patient agrees to proceed.  Description of Procedure: Patient was seen in the holding area and questions were answered.  She was taken back to the operative room.  She is placed supine upon the OR table.  After induction of general esthesia both breasts were prepped draped sterile fashion timeout performed.  Of note she received bilateral pectoral blocks per anesthesia.  The plastic surgeon and drawn triangular incisions for skin sparing mastectomy since she had too much ptosis for nipple preservation.  The right side was done first.  Incision made along the triangular incision around the nipple were complex.  The skin flaps were raised in a superior fashion the inferior fashion down to the inframammary fold and clavicle  superiorly.  The breast was then dissected off the chest wall to include the fascia in a medial to lateral fashion to the lateral attachments were encountered and the breast was amputated on the right.  Hemostasis achieved.  There are some scarring along the inferior flap from previous lumpectomy and I cleaned this up with sharp dissection taking care not to injure the skin.  Hemostasis achieved.  Left side was done in similar fashion.  The triangular incision was made along the outside of the nipple areolar complex.  The superior skin flap was taken to the clavicle and the inferior skin flap was taken to the inframammary fold.  Breast was then dissected in the medial lateral fashion off the pectoralis major fascia to include until the lateral attachments were encountered and divided.  Hemostasis achieved with cautery.  At this portion the case plastic surgery took over for expansion and reconstruction.  Please see their operative note for detail.  All counts were correct.  EBL 70 cc total.  The patient was stable.  Turner Daniels, MD, FACS

## 2019-11-27 NOTE — Op Note (Addendum)
Operative Note   DATE OF OPERATION: 5.11.21  LOCATION: [facility]-observation  SURGICAL DIVISION: Plastic Surgery  PREOPERATIVE DIAGNOSES:  1. High risk for breast cancer 2. Family history breast cancer  POSTOPERATIVE DIAGNOSES:  same  PROCEDURE:  1. Bilateral breast reconstruction with tissue expanders 2. Acellular dermis (Alloderm) to bilateral chest 600 cm2  SURGEON: Irene Limbo MD MBA  ASSISTANT: none  ANESTHESIA:  General.   EBL: 150 ml for entire procedure  COMPLICATIONS: None immediate.   INDICATIONS FOR PROCEDURE:  The patient, Christina Baldwin, is a 44 y.o. female born on May 30, 1976, is here for immediate prepectoral breast reconstruction following skin reduction pattern mastectomies.   FINDINGS: Natrelle 133S-FV-12-T 400 ml tissue expanders placed bilateral, initial fill volume 300 ml air bilateral. RIGHT SN [id number] LEFT SN [id number]  DESCRIPTION OF PROCEDURE:  The patient was marked with the patient in the preoperative area to mark sternal notch, chest midline, anterior axillary lines and inframammary folds. Patient was marked for skin reduction mastectomy with most superior portion nipple areola marked on breast meridian. Vertical limbs marked by breast displacement and set at9cm length. The patient was taken to the operating room. SCDs were placed and IV antibiotics were given. Foley catheter placed. The patient's operative site was prepped and draped in a sterile fashion. A time out was performed and all information was confirmed to be correct. In supine position, the lateral limbs for resection marked and area over lower pole preserved as inferiorly based dermal pedicle. Skin de epithelialized in this area.Following completion of mastectomies, reconstruction began onrightside.  The cavity was irrigated with solution containingAncef, gentamicin, andbacitracin. Hemostasis was ensured. A 19 Fr drain was placed in subcutaneous position laterally anda 15  Fr drain placed along inframammary fold. Eachsecured to skin with 2-0 nylon. Cavity irrigated withsaline solution withBetadine.The tissue expanderswere prepared on back table prior in insertion. The expander was filled with air.Perforated acellular dermis was draped over anterior surface expander. The ADM was then secured to itself over posterior surface of expanderwith 4-0 chromic. Redundant folds acellular dermis excised so that the ADM layflat without folds over air filled expander.The expander was secured to medial insertion pectoralis with a 0 vicryl.The superior and lateral tabs also secured to pectoralis muscle with 0-vicryl. The ADM was secured to pectoralis muscle and chest wall at inframammary foldwith 0 V lock suture.Laterally the mastectomy flap over posterior axillary line was advanced anteriorly and the subcutaneous tissue and superficial fascia was secured tochest wallwith 0-vicryl. The inferiorly based dermal pedicle was redraped superiorly over expander and acellular dermis and secured to pectoralis with interrupted 0-vicryl. Skin closure completedwith 3-0 vicryl in fascial layer and 4-0 vicryl in dermis. Skin closure completed with 4-0 monocryl subcuticular and tissue adhesive.  I then directed my attention to leftchest where similar irrigation and drain placement completed. The prepared expander with ADM secured over anterior surface was placed inleft chest and tabs secured to chest wall and pectoralis muscle with 0- vicryl suture. The acellular dermis at inframammary fold was secured to chest wall with 0 V-lock suture.Laterally the mastectomy flap over posterior axillary line was advanced anteriorly and the subcutaneous tissue and superficial fascia was secured tochest wallwith 0-vicryl. The inferiorly based dermal pedicle was redraped superiorly over expander and acellular dermis and secured to pectoralis with interrupted 0-vicryl. Skin closure completedwith 3-0 vicryl  in fascial layer and 4-0 vicryl in dermis. Skin closure completed with 4-0 monocryl subcuticular and tissue adhesive.Tegaderms applied bilateral, followed by dry dressing and breast binder.  The  patient was allowed to wake from anesthesia, extubated and taken to the recovery room in satisfactory condition.   SPECIMENS: none  DRAINS: 15 and 19 Fr JP in right and left breast reconstruction

## 2019-11-27 NOTE — Transfer of Care (Signed)
Immediate Anesthesia Transfer of Care Note  Patient: Christina Baldwin  Procedure(s) Performed: BILATERAL SIMPLE MASTECTOMY (Bilateral Breast) BILATERAL BREAST RECONSTRUCTION WITH PLACEMENT OF TISSUE EXPANDER AND ALLODERM (Bilateral Breast)  Patient Location: PACU  Anesthesia Type:GA combined with regional for post-op pain  Level of Consciousness: awake, alert  and oriented  Airway & Oxygen Therapy: Patient Spontanous Breathing and Patient connected to face mask oxygen  Post-op Assessment: Report given to RN and Post -op Vital signs reviewed and stable  Post vital signs: Reviewed and stable  Last Vitals:  Vitals Value Taken Time  BP 138/66 11/27/19 1615  Temp    Pulse 107 11/27/19 1616  Resp 24 11/27/19 1616  SpO2 100 % 11/27/19 1616  Vitals shown include unvalidated device data.  Last Pain:  Vitals:   11/27/19 1020  TempSrc: Oral  PainSc: 0-No pain         Complications: No apparent anesthesia complications

## 2019-11-27 NOTE — Anesthesia Procedure Notes (Signed)
Procedure Name: Intubation Date/Time: 11/27/2019 12:11 PM Performed by: Bufford Spikes, CRNA Pre-anesthesia Checklist: Patient identified, Emergency Drugs available, Suction available and Patient being monitored Patient Re-evaluated:Patient Re-evaluated prior to induction Oxygen Delivery Method: Circle system utilized Preoxygenation: Pre-oxygenation with 100% oxygen Induction Type: IV induction Ventilation: Mask ventilation without difficulty Laryngoscope Size: Miller and 2 Grade View: Grade II Tube type: Oral Tube size: 7.0 mm Number of attempts: 1 Airway Equipment and Method: Stylet and Oral airway Placement Confirmation: ETT inserted through vocal cords under direct vision,  positive ETCO2 and breath sounds checked- equal and bilateral Secured at: 21 cm Tube secured with: Tape Dental Injury: Teeth and Oropharynx as per pre-operative assessment

## 2019-11-28 ENCOUNTER — Encounter: Payer: Self-pay | Admitting: *Deleted

## 2019-11-28 DIAGNOSIS — Z1501 Genetic susceptibility to malignant neoplasm of breast: Secondary | ICD-10-CM | POA: Diagnosis not present

## 2019-11-28 LAB — GLUCOSE, CAPILLARY: Glucose-Capillary: 107 mg/dL — ABNORMAL HIGH (ref 70–99)

## 2019-11-28 NOTE — Discharge Summary (Signed)
Physician Discharge Summary  Patient ID: Christina Baldwin MRN: [id number] DOB/AGE: 09-23-1975 44 y.o.  Admit date: 11/27/2019 Discharge date: 11/28/2019  Admission Diagnoses: High risk breast cancer family history breast cancer  Discharge Diagnoses:  same  Discharged Condition: stable  Hospital Course: Post operatively patient did well with pain controlled with oral medication, tolerating diet, and ambulatory with minimal assist. Instructed on bathing and drain care.  Treatments: surgery: bilateral skin reduction mastectomies bilateral breast reconstruction with tissue expanders acellular dermis 5.11.21  Discharge Exam: Blood pressure 122/66, pulse 64, temperature 98.4 F (36.9 C), resp. rate 16, height 5\' 4"  (1.626 m), weight 93.8 kg, SpO2 95 %. Incision/Wound: Tegaderms in place chest soft incisions dry intact drains serosanguinous  Disposition: Discharge disposition: 01-Home or Self Care       Discharge Instructions    Call MD for:  redness, tenderness, or signs of infection (pain, swelling, bleeding, redness, odor or green/yellow discharge around incision site)   Complete by: As directed    Call MD for:  temperature >100.5   Complete by: As directed    Discharge instructions   Complete by: As directed    Ok to remove dressings and shower am 5.13.21. Soap and water ok, pat Tegaderms dry. Do not remove Tegaderma. No creams or ointments over incisions. Do not let drains dangle in shower, attach to lanyard or similar.Strip and record drains twice daily and bring log to clinic visit.  Breast binder or soft compression bra all other times.  Ok to raise arms above shoulders for bathing and dressing.  No house yard work or exercise until cleared by MD.   Patient given all Rx preop. Ice packs to chest for comfort. Recommend Miralax or Dulcolax as needed for constipation.   Driving Restrictions   Complete by: As directed    No driving for 2 weeks then no driving if  taking narcotics   Lifting restrictions   Complete by: As directed    No lifting > 5 lbs until cleared by MD   Resume previous diet   Complete by: As directed      Allergies as of 11/28/2019      Reactions   Sulfa Antibiotics    Bactrim      Medication List    TAKE these medications   cloNIDine 0.1 MG tablet Commonly known as: CATAPRES Take 0.1 mg by mouth 2 (two) times daily.   fenofibrate 160 MG tablet Take 160 mg by mouth daily.   lisinopril 10 MG tablet Commonly known as: ZESTRIL Take 10 mg by mouth daily.   metFORMIN 500 MG tablet Commonly known as: GLUCOPHAGE Take 500 mg by mouth daily with breakfast.   methocarbamol 750 MG tablet Commonly known as: ROBAXIN Take 750 mg by mouth 4 (four) times daily.   rosuvastatin 20 MG tablet Commonly known as: CRESTOR Take 20 mg by mouth daily.   SUMAtriptan 50 MG tablet Commonly known as: IMITREX Take 50 mg by mouth every 2 (two) hours as needed for migraine. May repeat in 2 hours if headache persists or recurs.   traZODone 50 MG tablet Commonly known as: DESYREL Take 50 mg by mouth at bedtime.      Follow-up Information    Irene Limbo, MD In 1 week.   Specialty: Plastic Surgery Why: as scheduled Contact information: [address] [place] [address] [phone]        Erroll Luna, MD. Schedule an appointment as soon as possible for a visit in  2 weeks.   Specialty: General Surgery Contact information: [address] [place] [address] [phone]           Signed: Irene Limbo 11/28/2019, 7:12 AM

## 2019-11-28 NOTE — Discharge Instructions (Signed)

## 2019-11-28 NOTE — Anesthesia Postprocedure Evaluation (Signed)
Anesthesia Post Note  Patient: [name]  Procedure(s) Performed: BILATERAL SIMPLE MASTECTOMY (Bilateral Breast) BILATERAL BREAST RECONSTRUCTION WITH PLACEMENT OF TISSUE EXPANDER AND ALLODERM (Bilateral Breast)     Patient location during evaluation: PACU Anesthesia Type: General Level of consciousness: awake and alert Pain management: pain level controlled Vital Signs Assessment: post-procedure vital signs reviewed and stable Respiratory status: spontaneous breathing, nonlabored ventilation and respiratory function stable Cardiovascular status: blood pressure returned to baseline and stable Postop Assessment: no apparent nausea or vomiting Anesthetic complications: no    Last Vitals:  Vitals:   11/28/19 0600 11/28/19 0700  BP:  110/74  Pulse: 64 81  Resp:  18  Temp:  36.6 C  SpO2: 95% 98%    Last Pain:  Vitals:   11/28/19 0700  TempSrc:   PainSc: 3                  Lidia Collum

## 2019-11-29 LAB — SURGICAL PATHOLOGY

## 2019-12-13 ENCOUNTER — Other Ambulatory Visit (HOSPITAL_COMMUNITY)
Admission: RE | Admit: 2019-12-13 | Discharge: 2019-12-13 | Disposition: A | Discharge status: Home / Self Care | Payer: Managed Care, Other (non HMO) | Source: Ambulatory Visit | Attending: Plastic Surgery | Admitting: Plastic Surgery

## 2019-12-13 ENCOUNTER — Other Ambulatory Visit: Payer: Self-pay

## 2019-12-13 ENCOUNTER — Encounter (HOSPITAL_BASED_OUTPATIENT_CLINIC_OR_DEPARTMENT_OTHER): Payer: Self-pay | Admitting: Plastic Surgery

## 2019-12-13 DIAGNOSIS — Z01812 Encounter for preprocedural laboratory examination: Secondary | ICD-10-CM | POA: Diagnosis present

## 2019-12-13 DIAGNOSIS — Z20822 Contact with and (suspected) exposure to covid-19: Secondary | ICD-10-CM | POA: Insufficient documentation

## 2019-12-13 LAB — SARS CORONAVIRUS 2 (TAT 6-24 HRS): SARS Coronavirus 2: NEGATIVE

## 2019-12-13 NOTE — H&P (Signed)
Subjective:    Patient ID: Christina Baldwin is a 44 y.o. female.  HPI  2.5 weeks post op. Reported fever >101 5.24.21. Cipro started. Returns noting red area medial right breast and intermittent fevers, yesterday to 101.4. Drains left < 5 ml per day, right< 20 ml per day  First MMG 2020 which showed 2 areas of concern in the right breast upper quadrant. 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 Family history breast ca in maternal aunt and cousin. Met with Dr. Lindi Adie who counseled approximately 24% lifetime risk of breast cancer by the [facility] model. Given likely pathogenic genetic mutation as below, risk felt to be overall higher than this.   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  MRI 09/2019 normal  Final pathology benign breast tissue bilateral.   Prior D/DD. Right mastectomy 772 g Left 554 g  PMH significant for pre diabetes on metformin, last HbA1c 5.4 03/2019. History seizure d/o- last over 10 years ago, no current medication.  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 Has aunt in [place] that would assist with post op care.  Review of Systems +fever    Objective:  Physical Exam  Cardiovascular: Normal rate, regular rhythm and normal heart sounds.  Pulmonary/Chest: Effort normal and breath sounds normal.    Chest: incisions intact, right superior pole with two areas of erythema Drains right serosanguinsous watery,left scant serous    Assessment:    High risk breast cancer Family history breast cancer S/p bilateral SRM, prepectoral TE/ADM (Alloderm) reconstruction Cellulitis right chest fevers    Plan:    Pictures today. Continued fevers and more definitive erythema right chest since last exam. Last visit  noted drain caught on drawer and pulled, had change in drain color after that to bright blood. I suspect the ADM has pulled away from skin flaps and not adherent leading to this. Regardless increased redness and continued fevers. Offered inpatient admission for IV antibiotics vs OR for removal. She has had hx MRSA, partner expresses concern for development sepsis. Plan removal right TE and ADM tomorrow.  Left drain removed. Will completed saline exchange in OR on left.  Natrelle 133S-FV-12-T 400 ml tissue expanders placed bilateral,  initial fill volume 300 ml air bilateral

## 2019-12-13 NOTE — Progress Notes (Signed)

## 2019-12-14 ENCOUNTER — Encounter (HOSPITAL_BASED_OUTPATIENT_CLINIC_OR_DEPARTMENT_OTHER): Payer: Self-pay | Admitting: Plastic Surgery

## 2019-12-14 ENCOUNTER — Ambulatory Visit (HOSPITAL_BASED_OUTPATIENT_CLINIC_OR_DEPARTMENT_OTHER)
Admission: RE | Admit: 2019-12-14 | Discharge: 2019-12-14 | Disposition: A | Discharge status: Home / Self Care | Payer: Managed Care, Other (non HMO) | Attending: Plastic Surgery | Admitting: Plastic Surgery

## 2019-12-14 ENCOUNTER — Other Ambulatory Visit: Payer: Self-pay

## 2019-12-14 ENCOUNTER — Ambulatory Visit (HOSPITAL_BASED_OUTPATIENT_CLINIC_OR_DEPARTMENT_OTHER): Payer: Managed Care, Other (non HMO) | Admitting: Anesthesiology

## 2019-12-14 ENCOUNTER — Encounter (HOSPITAL_BASED_OUTPATIENT_CLINIC_OR_DEPARTMENT_OTHER)
Admission: RE | Disposition: A | Discharge status: Home / Self Care | Payer: Self-pay | Source: Home / Self Care | Attending: Plastic Surgery

## 2019-12-14 DIAGNOSIS — Z8669 Personal history of other diseases of the nervous system and sense organs: Secondary | ICD-10-CM | POA: Diagnosis not present

## 2019-12-14 DIAGNOSIS — Z803 Family history of malignant neoplasm of breast: Secondary | ICD-10-CM | POA: Insufficient documentation

## 2019-12-14 DIAGNOSIS — Z8614 Personal history of Methicillin resistant Staphylococcus aureus infection: Secondary | ICD-10-CM | POA: Insufficient documentation

## 2019-12-14 DIAGNOSIS — R509 Fever, unspecified: Secondary | ICD-10-CM | POA: Diagnosis not present

## 2019-12-14 DIAGNOSIS — N61 Mastitis without abscess: Secondary | ICD-10-CM | POA: Diagnosis present

## 2019-12-14 DIAGNOSIS — B964 Proteus (mirabilis) (morganii) as the cause of diseases classified elsewhere: Secondary | ICD-10-CM | POA: Insufficient documentation

## 2019-12-14 DIAGNOSIS — G40909 Epilepsy, unspecified, not intractable, without status epilepticus: Secondary | ICD-10-CM | POA: Insufficient documentation

## 2019-12-14 DIAGNOSIS — Z9013 Acquired absence of bilateral breasts and nipples: Secondary | ICD-10-CM | POA: Diagnosis not present

## 2019-12-14 DIAGNOSIS — E119 Type 2 diabetes mellitus without complications: Secondary | ICD-10-CM | POA: Diagnosis not present

## 2019-12-14 HISTORY — PX: TISSUE EXPANDER PLACEMENT: SHX2530

## 2019-12-14 HISTORY — DX: Malignant (primary) neoplasm, unspecified: C80.1

## 2019-12-14 LAB — GLUCOSE, CAPILLARY
Glucose-Capillary: 103 mg/dL — ABNORMAL HIGH (ref 70–99)
Glucose-Capillary: 131 mg/dL — ABNORMAL HIGH (ref 70–99)

## 2019-12-14 LAB — POCT PREGNANCY, URINE: Preg Test, Ur: NEGATIVE

## 2019-12-14 SURGERY — INSERTION, TISSUE EXPANDER
Anesthesia: General | Site: Chest | Laterality: Bilateral

## 2019-12-14 MED ORDER — PHENYLEPHRINE 40 MCG/ML (10ML) SYRINGE FOR IV PUSH (FOR BLOOD PRESSURE SUPPORT)
PREFILLED_SYRINGE | INTRAVENOUS | Status: AC
  Filled 2019-12-14: qty 10

## 2019-12-14 MED ORDER — PROPOFOL 10 MG/ML IV BOLUS
INTRAVENOUS | Status: AC
  Filled 2019-12-14: qty 20

## 2019-12-14 MED ORDER — GABAPENTIN 300 MG PO CAPS
ORAL_CAPSULE | ORAL | Status: AC
  Filled 2019-12-14: qty 1

## 2019-12-14 MED ORDER — LIDOCAINE HCL (CARDIAC) PF 100 MG/5ML IV SOSY
PREFILLED_SYRINGE | INTRAVENOUS | Status: DC | PRN
  Administered 2019-12-14: 60 mg via INTRAVENOUS

## 2019-12-14 MED ORDER — ONDANSETRON HCL 4 MG/2ML IJ SOLN
INTRAMUSCULAR | Status: AC
  Filled 2019-12-14: qty 2

## 2019-12-14 MED ORDER — ACETAMINOPHEN 500 MG PO TABS
ORAL_TABLET | ORAL | Status: AC
  Filled 2019-12-14: qty 2

## 2019-12-14 MED ORDER — PROPOFOL 10 MG/ML IV BOLUS
INTRAVENOUS | Status: DC | PRN
  Administered 2019-12-14: 200 mg via INTRAVENOUS

## 2019-12-14 MED ORDER — FENTANYL CITRATE (PF) 100 MCG/2ML IJ SOLN
25.0000 ug | INTRAMUSCULAR | Status: DC | PRN
  Administered 2019-12-14 (×3): 50 ug via INTRAVENOUS

## 2019-12-14 MED ORDER — LACTATED RINGERS IV SOLN: INTRAVENOUS | Status: DC

## 2019-12-14 MED ORDER — FENTANYL CITRATE (PF) 100 MCG/2ML IJ SOLN
INTRAMUSCULAR | Status: AC
  Filled 2019-12-14: qty 2

## 2019-12-14 MED ORDER — CELECOXIB 200 MG PO CAPS
200.0000 mg | ORAL_CAPSULE | ORAL | Status: AC
  Administered 2019-12-14: 200 mg via ORAL

## 2019-12-14 MED ORDER — VANCOMYCIN HCL IN DEXTROSE 1-5 GM/200ML-% IV SOLN
INTRAVENOUS | Status: AC
  Filled 2019-12-14: qty 200

## 2019-12-14 MED ORDER — ACETAMINOPHEN 500 MG PO TABS
1000.0000 mg | ORAL_TABLET | ORAL | Status: AC
  Administered 2019-12-14: 1000 mg via ORAL

## 2019-12-14 MED ORDER — CIPROFLOXACIN HCL 100 MG PO TABS: 100.0000 mg | ORAL_TABLET | Freq: Two times a day (BID) | ORAL | 0 refills | Status: DC

## 2019-12-14 MED ORDER — FENTANYL CITRATE (PF) 100 MCG/2ML IJ SOLN
INTRAMUSCULAR | Status: DC | PRN
  Administered 2019-12-14: 25 ug via INTRAVENOUS
  Administered 2019-12-14: 50 ug via INTRAVENOUS

## 2019-12-14 MED ORDER — MIDAZOLAM HCL 5 MG/5ML IJ SOLN
INTRAMUSCULAR | Status: DC | PRN
  Administered 2019-12-14: 2 mg via INTRAVENOUS

## 2019-12-14 MED ORDER — MIDAZOLAM HCL 2 MG/2ML IJ SOLN
INTRAMUSCULAR | Status: AC
  Filled 2019-12-14: qty 2

## 2019-12-14 MED ORDER — DEXAMETHASONE SODIUM PHOSPHATE 10 MG/ML IJ SOLN
INTRAMUSCULAR | Status: AC
  Filled 2019-12-14: qty 1

## 2019-12-14 MED ORDER — GABAPENTIN 300 MG PO CAPS
300.0000 mg | ORAL_CAPSULE | ORAL | Status: AC
  Administered 2019-12-14: 300 mg via ORAL

## 2019-12-14 MED ORDER — ONDANSETRON HCL 4 MG/2ML IJ SOLN: 4.0000 mg | Freq: Once | INTRAMUSCULAR | Status: DC | PRN

## 2019-12-14 MED ORDER — LIDOCAINE 2% (20 MG/ML) 5 ML SYRINGE
INTRAMUSCULAR | Status: AC
  Filled 2019-12-14: qty 5

## 2019-12-14 MED ORDER — VANCOMYCIN HCL IN DEXTROSE 1-5 GM/200ML-% IV SOLN
1000.0000 mg | INTRAVENOUS | Status: AC
  Administered 2019-12-14: 1000 mg via INTRAVENOUS

## 2019-12-14 MED ORDER — DEXAMETHASONE SODIUM PHOSPHATE 4 MG/ML IJ SOLN
INTRAMUSCULAR | Status: DC | PRN
  Administered 2019-12-14: 5 mg via INTRAVENOUS

## 2019-12-14 MED ORDER — SODIUM CHLORIDE 0.9 % IV SOLN
INTRAVENOUS | Status: DC | PRN
  Administered 2019-12-14: 1000 mL

## 2019-12-14 MED ORDER — OXYCODONE HCL 5 MG PO CAPS: 5.0000 mg | ORAL_CAPSULE | ORAL | 0 refills | Status: DC | PRN

## 2019-12-14 MED ORDER — CELECOXIB 200 MG PO CAPS
ORAL_CAPSULE | ORAL | Status: AC
  Filled 2019-12-14: qty 1

## 2019-12-14 MED ORDER — ONDANSETRON HCL 4 MG/2ML IJ SOLN
INTRAMUSCULAR | Status: DC | PRN
  Administered 2019-12-14: 4 mg via INTRAVENOUS

## 2019-12-14 SURGICAL SUPPLY — 57 items
BAG DECANTER FOR FLEXI CONT (MISCELLANEOUS) ×2 IMPLANT
BINDER BREAST LRG (GAUZE/BANDAGES/DRESSINGS) IMPLANT
BINDER BREAST MEDIUM (GAUZE/BANDAGES/DRESSINGS) IMPLANT
BINDER BREAST XLRG (GAUZE/BANDAGES/DRESSINGS) ×2 IMPLANT
BINDER BREAST XXLRG (GAUZE/BANDAGES/DRESSINGS) IMPLANT
BLADE SURG 10 STRL SS (BLADE) ×4 IMPLANT
BLADE SURG 15 STRL LF DISP TIS (BLADE) ×1 IMPLANT
BLADE SURG 15 STRL SS (BLADE) ×1
BNDG GAUZE ELAST 4 BULKY (GAUZE/BANDAGES/DRESSINGS) IMPLANT
CANISTER SUCT 1200ML W/VALVE (MISCELLANEOUS) ×4 IMPLANT
CHLORAPREP W/TINT 26 (MISCELLANEOUS) ×4 IMPLANT
COVER BACK TABLE 60X90IN (DRAPES) ×2 IMPLANT
COVER MAYO STAND STRL (DRAPES) ×2 IMPLANT
COVER WAND RF STERILE (DRAPES) IMPLANT
DECANTER SPIKE VIAL GLASS SM (MISCELLANEOUS) IMPLANT
DERMABOND ADVANCED (GAUZE/BANDAGES/DRESSINGS) ×1
DERMABOND ADVANCED .7 DNX12 (GAUZE/BANDAGES/DRESSINGS) ×1 IMPLANT
DRAIN CHANNEL 15F RND FF W/TCR (WOUND CARE) IMPLANT
DRAIN CHANNEL 19F RND (DRAIN) ×2 IMPLANT
DRAPE TOP ARMCOVERS (MISCELLANEOUS) ×2 IMPLANT
DRAPE U-SHAPE 76X120 STRL (DRAPES) ×2 IMPLANT
DRAPE UTILITY XL STRL (DRAPES) ×2 IMPLANT
DRSG PAD ABDOMINAL 8X10 ST (GAUZE/BANDAGES/DRESSINGS) ×4 IMPLANT
ELECT BLADE 4.0 EZ CLEAN MEGAD (MISCELLANEOUS) ×2
ELECT COATED BLADE 2.86 ST (ELECTRODE) ×2 IMPLANT
ELECT REM PT RETURN 9FT ADLT (ELECTROSURGICAL) ×2
ELECTRODE BLDE 4.0 EZ CLN MEGD (MISCELLANEOUS) ×1 IMPLANT
ELECTRODE REM PT RTRN 9FT ADLT (ELECTROSURGICAL) ×1 IMPLANT
EVACUATOR SILICONE 100CC (DRAIN) ×2 IMPLANT
GLOVE BIO SURGEON STRL SZ 6 (GLOVE) ×6 IMPLANT
GOWN STRL REUS W/ TWL LRG LVL3 (GOWN DISPOSABLE) ×3 IMPLANT
GOWN STRL REUS W/TWL LRG LVL3 (GOWN DISPOSABLE) ×3
IV NS 500ML (IV SOLUTION) ×1
IV NS 500ML BAXH (IV SOLUTION) ×1 IMPLANT
KIT FILL SYSTEM UNIVERSAL (SET/KITS/TRAYS/PACK) IMPLANT
NEEDLE HYPO 25X1 1.5 SAFETY (NEEDLE) ×2 IMPLANT
PENCIL SMOKE EVACUATOR (MISCELLANEOUS) ×2 IMPLANT
PIN SAFETY STERILE (MISCELLANEOUS) ×2 IMPLANT
SET BASIN DAY SURGERY F.S. (CUSTOM PROCEDURE TRAY) ×2 IMPLANT
SHEET MEDIUM DRAPE 40X70 STRL (DRAPES) ×2 IMPLANT
SLEEVE SCD COMPRESS KNEE MED (MISCELLANEOUS) ×2 IMPLANT
SPONGE LAP 18X18 RF (DISPOSABLE) ×2 IMPLANT
STAPLER VISISTAT 35W (STAPLE) ×2 IMPLANT
SUCTION FRAZIER HANDLE 10FR (MISCELLANEOUS)
SUCTION TUBE FRAZIER 10FR DISP (MISCELLANEOUS) IMPLANT
SUT ETHILON 2 0 FS 18 (SUTURE) IMPLANT
SUT MNCRL AB 4-0 PS2 18 (SUTURE) IMPLANT
SUT PDS AB 2-0 CT2 27 (SUTURE) IMPLANT
SUT VIC AB 3-0 SH 27 (SUTURE)
SUT VIC AB 3-0 SH 27X BRD (SUTURE) IMPLANT
SUT VICRYL 4-0 PS2 18IN ABS (SUTURE) IMPLANT
SYR BULB IRRIG 60ML STRL (SYRINGE) ×4 IMPLANT
SYR CONTROL 10ML LL (SYRINGE) ×2 IMPLANT
TOWEL GREEN STERILE FF (TOWEL DISPOSABLE) ×4 IMPLANT
TUBE CONNECTING 20X1/4 (TUBING) ×4 IMPLANT
UNDERPAD 30X36 HEAVY ABSORB (UNDERPADS AND DIAPERS) ×4 IMPLANT
YANKAUER SUCT BULB TIP NO VENT (SUCTIONS) ×2 IMPLANT

## 2019-12-14 NOTE — Anesthesia Postprocedure Evaluation (Signed)
Anesthesia Post Note  Patient: [name]  Procedure(s) Performed: REMOVAL RIGHT CHEST TISSUE EXPANDER, TISSUE EXPANSION LEFT CHEST (Bilateral Chest)     Patient location during evaluation: PACU Anesthesia Type: General Level of consciousness: awake and alert Pain management: pain level controlled Vital Signs Assessment: post-procedure vital signs reviewed and stable Respiratory status: spontaneous breathing, nonlabored ventilation, respiratory function stable and patient connected to nasal cannula oxygen Cardiovascular status: blood pressure returned to baseline and stable Postop Assessment: no apparent nausea or vomiting Anesthetic complications: no    Last Vitals:  Vitals:   12/14/19 1215 12/14/19 1230  BP: 115/68 104/62  Pulse: 78 70  Resp: 20 15  Temp:    SpO2: 100% 95%    Last Pain:  Vitals:   12/14/19 1230  TempSrc:   PainSc: 4                  Tiajuana Amass

## 2019-12-14 NOTE — Interval H&P Note (Signed)
History and Physical Interval Note:  12/14/2019 10:18 AM  Christina Baldwin  has presented today for surgery, with the diagnosis of acquired absence breasts, cellulitis chest, high risk breast cancer.  The various methods of treatment have been discussed with the patient and family. After consideration of risks, benefits and other options for treatment, the patient has consented to  Procedure(s): REMOVAL RIGHT CHEST TISSUE EXPANDER, TISSUE EXPANSION LEFT CHEST (Bilateral) as a surgical intervention.  The patient's history has been reviewed, patient examined, no change in status, stable for surgery.  I have reviewed the patient's chart and labs.  Questions were answered to the patient's satisfaction.     [name] [name]

## 2019-12-14 NOTE — Discharge Instructions (Signed)
Post Anesthesia Home Care Instructions  Activity: Get plenty of rest for the remainder of the day. A responsible individual must stay with you for 24 hours following the procedure.  For the next 24 hours, DO NOT: -Drive a car -Paediatric nurse -Drink alcoholic beverages -Take any medication unless instructed by your physician -Make any legal decisions or sign important papers.  Meals: Start with liquid foods such as gelatin or soup. Progress to regular foods as tolerated. Avoid greasy, spicy, heavy foods. If nausea and/or vomiting occur, drink only clear liquids until the nausea and/or vomiting subsides. Call your physician if vomiting continues.  Special Instructions/Symptoms: Your throat may feel dry or sore from the anesthesia or the breathing tube placed in your throat during surgery. If this causes discomfort, gargle with warm salt water. The discomfort should disappear within 24 hours.  If you had a scopolamine patch placed behind your ear for the management of post- operative nausea and/or vomiting:  1. The medication in the patch is effective for 72 hours, after which it should be removed.  Wrap patch in a tissue and discard in the trash. Wash hands thoroughly with soap and water. 2. You may remove the patch earlier than 72 hours if you experience unpleasant side effects which may include dry mouth, dizziness or visual disturbances. 3. Avoid touching the patch. Wash your hands with soap and water after contact with the patch.    Call your surgeon if you experience:   1.  Fever over 101.0. 2.  Inability to urinate. 3.  Nausea and/or vomiting. 4.  Extreme swelling or bruising at the surgical site. 5.  Continued bleeding from the incision. 6.  Increased pain, redness or drainage from the incision. 7.  Problems related to your pain medication. 8.  Any problems and/or concerns  About my Jackson-Pratt Bulb Drain  What is a Jackson-Pratt bulb? A Jackson-Pratt is a soft, round  device used to collect drainage. It is connected to a long, thin drainage catheter, which is held in place by one or two small stiches near your surgical incision site. When the bulb is squeezed, it forms a vacuum, forcing the drainage to empty into the bulb.  Emptying the Jackson-Pratt bulb- To empty the bulb: 1. Release the plug on the top of the bulb. 2. Pour the bulb's contents into a measuring container which your nurse will provide. 3. Record the time emptied and amount of drainage. Empty the drain(s) as often as your     doctor or nurse recommends.  Date                  Time                    Amount (Drain 1)                 Amount (Drain 2)  _____________________________________________________________________  _____________________________________________________________________  _____________________________________________________________________  _____________________________________________________________________  _____________________________________________________________________  _____________________________________________________________________  _____________________________________________________________________  _____________________________________________________________________  Squeezing the Jackson-Pratt Bulb- To squeeze the bulb: 1. Make sure the plug at the top of the bulb is open. 2. Squeeze the bulb tightly in your fist. You will hear air squeezing from the bulb. 3. Replace the plug while the bulb is squeezed. 4. Use a safety pin to attach the bulb to your clothing. This will keep the catheter from     pulling at the bulb insertion site.  When to call your doctor- Call your doctor if:  Drain site becomes red, swollen  or hot.  You have a fever greater than 101 degrees F.  There is oozing at the drain site.  Drain falls out (apply a guaze bandage over the drain hole and secure it with tape).  Drainage increases daily not related to  activity patterns. (You will usually have more drainage when you are active than when you are resting.)  Drainage has a bad odor.  *May have Tylenol at 4:30pm today *May have Ibuprofen at 6:30pm today

## 2019-12-14 NOTE — Op Note (Signed)
Operative Note   DATE OF OPERATION: 5.28.21  LOCATION: [facility]-outpatient  SURGICAL DIVISION: Plastic Surgery  PREOPERATIVE DIAGNOSES:  1. Cellulitis right chest 2. Acquired absence breasts 3. High risk breast cancer  POSTOPERATIVE DIAGNOSES:  same  PROCEDURE:  1. Removal right chest tissue expander 2. Left chest tissue expansion  SURGEON: Irene Limbo MD MBA  ASSISTANT: none  ANESTHESIA:  General.   EBL: 15 ml  COMPLICATIONS: None immediate.   INDICATIONS FOR PROCEDURE:  The patient, Christina Baldwin, is a 43 y.o. female born on 13-Sep-1975, is here for removal right chest expander. She is 2.5 weeks postoperative from bilateral mastectomies with immediate expander acellular dermis reconstruction. She developed onset fevers 4 days ago with cellulitis that has not responded to oral antibioitcs.    FINDINGS: Saline exchange completed over left chest to total fill volume 410 ml. Over right chest no significant fluid noted in pocket. ADM removed in its entirety.  DESCRIPTION OF PROCEDURE:  The patient's operative site was marked with the patient in the preoperative area. The patient was taken to the operating room. SCDs were placed and IV antibiotics were given. The patient's operative site was prepped and draped in a sterile fashion. A time out was performed and all information was confirmed to be correct. Incision made in right inframammary fold scar and carried through superficial fascia and ADM into expander cavity. Cultures obtained. Expander and entirety acellular dermis removed. Curettage performed. Cavity irrigated with saline solution containing Ancef, gentamicin, and bacitracin, followed by Betadine saline solution. 19 Fr JP placed in cavity. Closure completed with 3-0 monocryl in superficial fascia followed by 3-0 monocryl in dermis, 4-0 monocryl subcuticular skin closure. Dermabond applied. I then removed drapes and left chest port accessed. Air in expander removed  and saline fill completed.   Dry dressing and breast binder applied.  The patient was allowed to wake from anesthesia, extubated and taken to the recovery room in satisfactory condition.   SPECIMENS: culture  DRAINS: 19 Fr JP in right subcutaneous chest

## 2019-12-14 NOTE — Anesthesia Procedure Notes (Signed)
Procedure Name: LMA Insertion Performed by: Brandi Armato, [place], CRNA Pre-anesthesia Checklist: Patient identified, Emergency Drugs available, Suction available and Patient being monitored Patient Re-evaluated:Patient Re-evaluated prior to induction Oxygen Delivery Method: Circle system utilized Preoxygenation: Pre-oxygenation with 100% oxygen Induction Type: IV induction Ventilation: Mask ventilation without difficulty LMA: LMA inserted LMA Size: 4.0 Number of attempts: 1 Airway Equipment and Method: Bite block Placement Confirmation: positive ETCO2 Tube secured with: Tape Dental Injury: Teeth and Oropharynx as per pre-operative assessment        

## 2019-12-14 NOTE — Anesthesia Preprocedure Evaluation (Signed)
Anesthesia Evaluation  Patient identified by MRN, date of birth, ID band Patient awake    Reviewed: Allergy & Precautions, NPO status , Patient's Chart, lab work & pertinent test results  History of Anesthesia Complications Negative for: history of anesthetic complications  Airway Mallampati: II  TM Distance: >3 FB Neck ROM: Full    Dental  (+) Teeth Intact   Pulmonary neg pulmonary ROS,    Pulmonary exam normal        Cardiovascular hypertension, Pt. on medications Normal cardiovascular exam     Neuro/Psych negative neurological ROS  negative psych ROS   GI/Hepatic negative GI ROS, Neg liver ROS,   Endo/Other  diabetes, Oral Hypoglycemic Agents  Renal/GU negative Renal ROS  negative genitourinary   Musculoskeletal negative musculoskeletal ROS (+)   Abdominal   Peds  Hematology negative hematology ROS (+)   Anesthesia Other Findings  Risk reducing bilateral mastectomies for family history of breast cancer and abnormal genetic screen  HLD  Reproductive/Obstetrics                             Anesthesia Physical  Anesthesia Plan  ASA: II  Anesthesia Plan: General   Post-op Pain Management:    Induction: Intravenous  PONV Risk Score and Plan: 3 and Ondansetron, Dexamethasone, Treatment may vary due to age or medical condition and Midazolam  Airway Management Planned: LMA  Additional Equipment: None  Intra-op Plan:   Post-operative Plan: Extubation in OR  Informed Consent: I have reviewed the patients History and Physical, chart, labs and discussed the procedure including the risks, benefits and alternatives for the proposed anesthesia with the patient or authorized representative who has indicated his/her understanding and acceptance.     Dental advisory given  Plan Discussed with: CRNA  Anesthesia Plan Comments:         Anesthesia Quick Evaluation

## 2019-12-14 NOTE — Transfer of Care (Signed)
Immediate Anesthesia Transfer of Care Note  Patient: Zendayah Renee Knupp  Procedure(s) Performed: REMOVAL RIGHT CHEST TISSUE EXPANDER, TISSUE EXPANSION LEFT CHEST (Bilateral Chest)  Patient Location: PACU  Anesthesia Type:General  Level of Consciousness: awake and alert   Airway & Oxygen Therapy: Patient Spontanous Breathing and Patient connected to face mask oxygen  Post-op Assessment: Report given to RN and Post -op Vital signs reviewed and stable  Post vital signs: Reviewed and stable  Last Vitals:  Vitals Value Taken Time  BP    Temp    Pulse 78 12/14/19 1150  Resp 12 12/14/19 1150  SpO2 100 % 12/14/19 1150  Vitals shown include unvalidated device data.  Last Pain:  Vitals:   12/14/19 1019  TempSrc: Oral  PainSc: 3          Complications: No apparent anesthesia complications

## 2019-12-18 ENCOUNTER — Encounter: Payer: Self-pay | Admitting: *Deleted

## 2019-12-19 LAB — AEROBIC/ANAEROBIC CULTURE W GRAM STAIN (SURGICAL/DEEP WOUND): Gram Stain: NONE SEEN

## 2020-01-09 NOTE — H&P (Signed)
Subjective:    Patient ID: Christina Baldwin is a 44 y.o. female.  HPI  6 weeks post op. Course complicated by onset fever 2 weeks postoperative with cellulitis right chest. Now 3.5 weeks post removal right TE/ADM, culture few Proteus Mirabilis, completed Cipro course.   First MMG 2020 which showed 2 areas of concern in the right breast upper quadrant. 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 Family history breast ca in maternal aunt and cousin. Met with Dr. Lindi Baldwin who counseled approximately 24% lifetime risk of breast cancer by the [facility] model. Given likely pathogenic genetic mutation as below, risk felt to be overall higher than this.   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  MRI 09/2019 normal  Final pathology benign breast tissue bilateral.   Prior D/DD. Right mastectomy 772 g Left 554 g  PMH significant for pre diabetes on metformin, last HbA1c 5.4 03/2019. History seizure d/o- last over 10 years ago, no current medication.  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 Has aunt in [place] that would assist with post op care.     Objective:  Physical Exam  Cardiovascular: Normal rate, regular rhythm and normal heart sounds.  Pulmonary/Chest: Effort normal and breath sounds normal.    Chest: left chest soft expanded, right chest incisions intact, soft tissue contracted  Assessment:    High risk breast cancer Family history breast cancer S/p bilateral SRM, prepectoral TE/ADM (Alloderm) reconstruction S/p removal right TE    Plan:  Recommend at least 6 weeks for replacement right TE. Scheduled for replacement right TE and ADM next month. Reviewd OP surgery, drains. Will give Rx at time of surgery. Notes  depression contour UOQ chest- reviewed this will be addressed as we move forward with reconstruction. Reviewed she will be required to have preop COVID testing again.   Natrelle 133S-FV-12-T 400 ml tissue expanders placed bilateral,  RIGHT fill volume 410 ml saline

## 2020-01-22 ENCOUNTER — Other Ambulatory Visit: Payer: Self-pay

## 2020-01-22 ENCOUNTER — Encounter (HOSPITAL_BASED_OUTPATIENT_CLINIC_OR_DEPARTMENT_OTHER): Payer: Self-pay | Admitting: Plastic Surgery

## 2020-01-25 ENCOUNTER — Encounter (HOSPITAL_BASED_OUTPATIENT_CLINIC_OR_DEPARTMENT_OTHER)
Admission: RE | Admit: 2020-01-25 | Discharge: 2020-01-25 | Disposition: A | Discharge status: Home / Self Care | Payer: Managed Care, Other (non HMO) | Source: Ambulatory Visit | Attending: Plastic Surgery | Admitting: Plastic Surgery

## 2020-01-25 ENCOUNTER — Other Ambulatory Visit (HOSPITAL_COMMUNITY)
Admission: RE | Admit: 2020-01-25 | Discharge: 2020-01-25 | Disposition: A | Discharge status: Home / Self Care | Payer: Managed Care, Other (non HMO) | Source: Ambulatory Visit | Attending: Plastic Surgery | Admitting: Plastic Surgery

## 2020-01-25 ENCOUNTER — Other Ambulatory Visit: Payer: Self-pay

## 2020-01-25 DIAGNOSIS — Z20822 Contact with and (suspected) exposure to covid-19: Secondary | ICD-10-CM | POA: Insufficient documentation

## 2020-01-25 DIAGNOSIS — Z01812 Encounter for preprocedural laboratory examination: Secondary | ICD-10-CM | POA: Insufficient documentation

## 2020-01-25 LAB — BASIC METABOLIC PANEL
Anion gap: 9 (ref 5–15)
BUN: 14 mg/dL (ref 6–20)
CO2: 24 mmol/L (ref 22–32)
Calcium: 9.3 mg/dL (ref 8.9–10.3)
Chloride: 101 mmol/L (ref 98–111)
Creatinine, Ser: 0.88 mg/dL (ref 0.44–1.00)
GFR calc Af Amer: >60 mL/min (ref >60)
GFR calc non Af Amer: >60 mL/min (ref >60)
Glucose, Bld: 93 mg/dL (ref 70–99)
Potassium: 4.2 mmol/L (ref 3.5–5.1)
Sodium: 134 mmol/L — ABNORMAL LOW (ref 135–145)

## 2020-01-25 LAB — SARS CORONAVIRUS 2 (TAT 6-24 HRS): SARS Coronavirus 2: NEGATIVE

## 2020-01-25 MED ORDER — CHLORHEXIDINE GLUCONATE CLOTH 2 % EX PADS: 6.0000 | MEDICATED_PAD | Freq: Once | CUTANEOUS | Status: DC

## 2020-01-25 NOTE — Progress Notes (Signed)

## 2020-01-28 ENCOUNTER — Encounter (HOSPITAL_BASED_OUTPATIENT_CLINIC_OR_DEPARTMENT_OTHER): Payer: Self-pay | Admitting: Plastic Surgery

## 2020-01-28 NOTE — Anesthesia Preprocedure Evaluation (Addendum)
Anesthesia Evaluation  Patient identified by MRN, date of birth, ID band Patient awake    Reviewed: Allergy & Precautions, NPO status , Patient's Chart, lab work & pertinent test results  Airway Mallampati: II  TM Distance: >3 FB Neck ROM: Full    Dental no notable dental hx. (+) Teeth Intact   Pulmonary neg pulmonary ROS,    Pulmonary exam normal breath sounds clear to auscultation       Cardiovascular hypertension, Pt. on medications Normal cardiovascular exam Rhythm:Regular Rate:Normal  EKG - normal   Neuro/Psych  Headaches, PSYCHIATRIC DISORDERS ADD   GI/Hepatic negative GI ROS, Neg liver ROS,   Endo/Other  Hyperlipidemia Obesity Acquired absence both breasts  Renal/GU negative Renal ROS  negative genitourinary   Musculoskeletal negative musculoskeletal ROS (+)   Abdominal (+) + obese,   Peds  Hematology negative hematology ROS (+)   Anesthesia Other Findings   Reproductive/Obstetrics                            Anesthesia Physical Anesthesia Plan  ASA: II  Anesthesia Plan: General   Post-op Pain Management:    Induction: Intravenous  PONV Risk Score and Plan: 4 or greater and Scopolamine patch - Pre-op, Midazolam, Dexamethasone, Ondansetron and Treatment may vary due to age or medical condition  Airway Management Planned: Oral ETT  Additional Equipment:   Intra-op Plan:   Post-operative Plan: Extubation in OR  Informed Consent: I have reviewed the patients History and Physical, chart, labs and discussed the procedure including the risks, benefits and alternatives for the proposed anesthesia with the patient or authorized representative who has indicated his/her understanding and acceptance.     Dental advisory given  Plan Discussed with: CRNA and Surgeon  Anesthesia Plan Comments:        Anesthesia Quick Evaluation

## 2020-01-29 ENCOUNTER — Encounter (HOSPITAL_BASED_OUTPATIENT_CLINIC_OR_DEPARTMENT_OTHER)
Admission: RE | Disposition: A | Discharge status: Home / Self Care | Payer: Self-pay | Source: Home / Self Care | Attending: Plastic Surgery

## 2020-01-29 ENCOUNTER — Ambulatory Visit (HOSPITAL_BASED_OUTPATIENT_CLINIC_OR_DEPARTMENT_OTHER)
Admission: RE | Admit: 2020-01-29 | Discharge: 2020-01-29 | Disposition: A | Discharge status: Home / Self Care | Payer: Managed Care, Other (non HMO) | Attending: Plastic Surgery | Admitting: Plastic Surgery

## 2020-01-29 ENCOUNTER — Other Ambulatory Visit: Payer: Self-pay

## 2020-01-29 ENCOUNTER — Ambulatory Visit (HOSPITAL_BASED_OUTPATIENT_CLINIC_OR_DEPARTMENT_OTHER): Payer: Managed Care, Other (non HMO) | Admitting: Certified Registered"

## 2020-01-29 ENCOUNTER — Encounter (HOSPITAL_BASED_OUTPATIENT_CLINIC_OR_DEPARTMENT_OTHER): Payer: Self-pay | Admitting: Plastic Surgery

## 2020-01-29 DIAGNOSIS — Z421 Encounter for breast reconstruction following mastectomy: Secondary | ICD-10-CM | POA: Diagnosis not present

## 2020-01-29 DIAGNOSIS — Z7984 Long term (current) use of oral hypoglycemic drugs: Secondary | ICD-10-CM | POA: Insufficient documentation

## 2020-01-29 DIAGNOSIS — Z803 Family history of malignant neoplasm of breast: Secondary | ICD-10-CM | POA: Insufficient documentation

## 2020-01-29 DIAGNOSIS — E119 Type 2 diabetes mellitus without complications: Secondary | ICD-10-CM | POA: Diagnosis not present

## 2020-01-29 HISTORY — PX: BREAST RECONSTRUCTION WITH PLACEMENT OF TISSUE EXPANDER AND ALLODERM: SHX6805

## 2020-01-29 LAB — POCT PREGNANCY, URINE: Preg Test, Ur: NEGATIVE

## 2020-01-29 LAB — GLUCOSE, CAPILLARY
Glucose-Capillary: 104 mg/dL — ABNORMAL HIGH (ref 70–99)
Glucose-Capillary: 116 mg/dL — ABNORMAL HIGH (ref 70–99)

## 2020-01-29 SURGERY — BREAST RECONSTRUCTION WITH PLACEMENT OF TISSUE EXPANDER AND ALLODERM
Anesthesia: General | Site: Breast | Laterality: Right

## 2020-01-29 MED ORDER — BUPIVACAINE HCL (PF) 0.5 % IJ SOLN
INTRAMUSCULAR | Status: DC | PRN
  Administered 2020-01-29: 20 mL

## 2020-01-29 MED ORDER — SCOPOLAMINE 1 MG/3DAYS TD PT72
MEDICATED_PATCH | TRANSDERMAL | Status: AC
  Filled 2020-01-29: qty 1

## 2020-01-29 MED ORDER — FENTANYL CITRATE (PF) 100 MCG/2ML IJ SOLN
INTRAMUSCULAR | Status: DC | PRN
  Administered 2020-01-29 (×4): 50 ug via INTRAVENOUS

## 2020-01-29 MED ORDER — OXYCODONE HCL 5 MG PO TABS: 5.0000 mg | ORAL_TABLET | Freq: Four times a day (QID) | ORAL | 0 refills | Status: DC | PRN

## 2020-01-29 MED ORDER — GABAPENTIN 300 MG PO CAPS
300.0000 mg | ORAL_CAPSULE | ORAL | Status: AC
  Administered 2020-01-29: 300 mg via ORAL

## 2020-01-29 MED ORDER — HYDROMORPHONE HCL 1 MG/ML IJ SOLN
INTRAMUSCULAR | Status: AC
  Filled 2020-01-29: qty 0.5

## 2020-01-29 MED ORDER — DEXAMETHASONE SODIUM PHOSPHATE 4 MG/ML IJ SOLN
INTRAMUSCULAR | Status: DC | PRN
  Administered 2020-01-29: 5 mg via INTRAVENOUS

## 2020-01-29 MED ORDER — FENTANYL CITRATE (PF) 100 MCG/2ML IJ SOLN
INTRAMUSCULAR | Status: AC
  Filled 2020-01-29: qty 2

## 2020-01-29 MED ORDER — SCOPOLAMINE 1 MG/3DAYS TD PT72
MEDICATED_PATCH | TRANSDERMAL | Status: DC | PRN
  Administered 2020-01-29: 1 via TRANSDERMAL

## 2020-01-29 MED ORDER — PHENYLEPHRINE HCL (PRESSORS) 10 MG/ML IV SOLN
INTRAVENOUS | Status: DC | PRN
  Administered 2020-01-29 (×2): 80 ug via INTRAVENOUS
  Administered 2020-01-29: 40 ug via INTRAVENOUS

## 2020-01-29 MED ORDER — PROPOFOL 500 MG/50ML IV EMUL
INTRAVENOUS | Status: AC
  Filled 2020-01-29: qty 50

## 2020-01-29 MED ORDER — CEFAZOLIN SODIUM-DEXTROSE 2-4 GM/100ML-% IV SOLN
2.0000 g | INTRAVENOUS | Status: AC
  Administered 2020-01-29: 2 g via INTRAVENOUS

## 2020-01-29 MED ORDER — PROPOFOL 10 MG/ML IV BOLUS
INTRAVENOUS | Status: DC | PRN
  Administered 2020-01-29: 180 mg via INTRAVENOUS

## 2020-01-29 MED ORDER — MIDAZOLAM HCL 2 MG/2ML IJ SOLN
INTRAMUSCULAR | Status: AC
  Filled 2020-01-29: qty 2

## 2020-01-29 MED ORDER — MIDAZOLAM HCL 5 MG/5ML IJ SOLN
INTRAMUSCULAR | Status: DC | PRN
  Administered 2020-01-29: 2 mg via INTRAVENOUS

## 2020-01-29 MED ORDER — CELECOXIB 200 MG PO CAPS
200.0000 mg | ORAL_CAPSULE | ORAL | Status: AC
  Administered 2020-01-29: 200 mg via ORAL

## 2020-01-29 MED ORDER — CIPROFLOXACIN HCL 500 MG PO TABS
500.0000 mg | ORAL_TABLET | Freq: Two times a day (BID) | ORAL | 0 refills | Status: AC
Start: 2020-01-29 — End: 2020-02-05

## 2020-01-29 MED ORDER — ACETAMINOPHEN 500 MG PO TABS
ORAL_TABLET | ORAL | Status: AC
  Filled 2020-01-29: qty 2

## 2020-01-29 MED ORDER — ACETAMINOPHEN 500 MG PO TABS
1000.0000 mg | ORAL_TABLET | ORAL | Status: AC
  Administered 2020-01-29: 1000 mg via ORAL

## 2020-01-29 MED ORDER — ONDANSETRON HCL 4 MG/2ML IJ SOLN
INTRAMUSCULAR | Status: AC
  Filled 2020-01-29: qty 2

## 2020-01-29 MED ORDER — SODIUM CHLORIDE 0.9 % IV SOLN
INTRAVENOUS | Status: DC | PRN
  Administered 2020-01-29: 500 mL

## 2020-01-29 MED ORDER — ROCURONIUM BROMIDE 100 MG/10ML IV SOLN
INTRAVENOUS | Status: DC | PRN
  Administered 2020-01-29: 60 mg via INTRAVENOUS

## 2020-01-29 MED ORDER — MEPERIDINE HCL 25 MG/ML IJ SOLN: 6.2500 mg | INTRAMUSCULAR | Status: DC | PRN

## 2020-01-29 MED ORDER — BUPIVACAINE HCL (PF) 0.5 % IJ SOLN
INTRAMUSCULAR | Status: AC
  Filled 2020-01-29: qty 30

## 2020-01-29 MED ORDER — CEFAZOLIN SODIUM-DEXTROSE 2-4 GM/100ML-% IV SOLN
INTRAVENOUS | Status: AC
  Filled 2020-01-29: qty 100

## 2020-01-29 MED ORDER — LACTATED RINGERS IV SOLN: INTRAVENOUS | Status: DC

## 2020-01-29 MED ORDER — METOCLOPRAMIDE HCL 5 MG/ML IJ SOLN: 10.0000 mg | Freq: Once | INTRAMUSCULAR | Status: DC | PRN

## 2020-01-29 MED ORDER — OXYCODONE HCL 5 MG PO TABS
ORAL_TABLET | ORAL | Status: AC
  Filled 2020-01-29: qty 1

## 2020-01-29 MED ORDER — HYDROMORPHONE HCL 1 MG/ML IJ SOLN
0.2500 mg | INTRAMUSCULAR | Status: DC | PRN
  Administered 2020-01-29: 0.25 mg via INTRAVENOUS
  Administered 2020-01-29: 0.5 mg via INTRAVENOUS

## 2020-01-29 MED ORDER — LIDOCAINE HCL (CARDIAC) PF 100 MG/5ML IV SOSY
PREFILLED_SYRINGE | INTRAVENOUS | Status: DC | PRN
  Administered 2020-01-29: 60 mg via INTRAVENOUS

## 2020-01-29 MED ORDER — SUGAMMADEX SODIUM 200 MG/2ML IV SOLN
INTRAVENOUS | Status: DC | PRN
  Administered 2020-01-29: 200 mg via INTRAVENOUS

## 2020-01-29 MED ORDER — GABAPENTIN 300 MG PO CAPS
ORAL_CAPSULE | ORAL | Status: AC
  Filled 2020-01-29: qty 1

## 2020-01-29 MED ORDER — LIDOCAINE 2% (20 MG/ML) 5 ML SYRINGE
INTRAMUSCULAR | Status: AC
  Filled 2020-01-29: qty 5

## 2020-01-29 MED ORDER — ONDANSETRON HCL 4 MG/2ML IJ SOLN
INTRAMUSCULAR | Status: DC | PRN
  Administered 2020-01-29: 4 mg via INTRAVENOUS

## 2020-01-29 MED ORDER — OXYCODONE HCL 5 MG PO TABS
5.0000 mg | ORAL_TABLET | Freq: Once | ORAL | Status: AC | PRN
  Administered 2020-01-29: 5 mg via ORAL

## 2020-01-29 MED ORDER — METHOCARBAMOL 750 MG PO TABS: 750.0000 mg | ORAL_TABLET | Freq: Four times a day (QID) | ORAL | 0 refills | Status: DC | PRN

## 2020-01-29 MED ORDER — DEXAMETHASONE SODIUM PHOSPHATE 10 MG/ML IJ SOLN
INTRAMUSCULAR | Status: AC
  Filled 2020-01-29: qty 1

## 2020-01-29 MED ORDER — OXYCODONE HCL 5 MG/5ML PO SOLN: 5.0000 mg | Freq: Once | ORAL | Status: AC | PRN

## 2020-01-29 MED ORDER — CELECOXIB 200 MG PO CAPS
ORAL_CAPSULE | ORAL | Status: AC
  Filled 2020-01-29: qty 1

## 2020-01-29 MED ORDER — POVIDONE-IODINE 10 % EX SOLN
CUTANEOUS | Status: DC | PRN
  Administered 2020-01-29: 1 via TOPICAL

## 2020-01-29 SURGICAL SUPPLY — 73 items
ALLOGRAFT PERF 16X20 1.6+/-0.4 (Tissue) ×2 IMPLANT
BAG DECANTER FOR FLEXI CONT (MISCELLANEOUS) ×2 IMPLANT
BINDER BREAST LRG (GAUZE/BANDAGES/DRESSINGS) IMPLANT
BINDER BREAST MEDIUM (GAUZE/BANDAGES/DRESSINGS) IMPLANT
BINDER BREAST XLRG (GAUZE/BANDAGES/DRESSINGS) IMPLANT
BINDER BREAST XXLRG (GAUZE/BANDAGES/DRESSINGS) IMPLANT
BLADE SURG 10 STRL SS (BLADE) ×2 IMPLANT
BLADE SURG 15 STRL LF DISP TIS (BLADE) ×1 IMPLANT
BLADE SURG 15 STRL SS (BLADE) ×1
BNDG GAUZE ELAST 4 BULKY (GAUZE/BANDAGES/DRESSINGS) IMPLANT
CANISTER SUCT 1200ML W/VALVE (MISCELLANEOUS) ×2 IMPLANT
CHLORAPREP W/TINT 26 (MISCELLANEOUS) ×4 IMPLANT
COUNTER NEEDLE 1200 MAGNETIC (NEEDLE) IMPLANT
COVER BACK TABLE 60X90IN (DRAPES) ×2 IMPLANT
COVER MAYO STAND STRL (DRAPES) ×2 IMPLANT
DERMABOND ADVANCED (GAUZE/BANDAGES/DRESSINGS) ×2
DERMABOND ADVANCED .7 DNX12 (GAUZE/BANDAGES/DRESSINGS) ×2 IMPLANT
DRAIN CHANNEL 15F RND FF W/TCR (WOUND CARE) ×2 IMPLANT
DRAIN CHANNEL 19F RND (DRAIN) ×2 IMPLANT
DRAPE INCISE IOBAN 66X45 STRL (DRAPES) IMPLANT
DRAPE TOP ARMCOVERS (MISCELLANEOUS) ×2 IMPLANT
DRAPE U-SHAPE 76X120 STRL (DRAPES) ×2 IMPLANT
DRAPE UTILITY XL STRL (DRAPES) ×4 IMPLANT
DRSG PAD ABDOMINAL 8X10 ST (GAUZE/BANDAGES/DRESSINGS) ×4 IMPLANT
DRSG TEGADERM 2-3/8X2-3/4 SM (GAUZE/BANDAGES/DRESSINGS) IMPLANT
DRSG TEGADERM 4X10 (GAUZE/BANDAGES/DRESSINGS) IMPLANT
ELECT BLADE 4.0 EZ CLEAN MEGAD (MISCELLANEOUS)
ELECT COATED BLADE 2.86 ST (ELECTRODE) ×2 IMPLANT
ELECTRODE BLDE 4.0 EZ CLN MEGD (MISCELLANEOUS) IMPLANT
EVACUATOR SILICONE 100CC (DRAIN) ×4 IMPLANT
EXPANDER TISSUE FV FOURTE 400 (Prosthesis & Implant Plastic) ×1 IMPLANT
GLOVE BIO SURGEON STRL SZ 6 (GLOVE) ×6 IMPLANT
GLOVE BIO SURGEON STRL SZ 6.5 (GLOVE) ×6 IMPLANT
GLOVE BIOGEL PI IND STRL 6.5 (GLOVE) IMPLANT
GLOVE BIOGEL PI IND STRL 7.0 (GLOVE) ×1 IMPLANT
GLOVE BIOGEL PI INDICATOR 6.5 (GLOVE)
GLOVE BIOGEL PI INDICATOR 7.0 (GLOVE) ×1
GOWN STRL REUS W/ TWL LRG LVL3 (GOWN DISPOSABLE) ×2 IMPLANT
GOWN STRL REUS W/TWL LRG LVL3 (GOWN DISPOSABLE) ×2
KIT FILL SYSTEM UNIVERSAL (SET/KITS/TRAYS/PACK) IMPLANT
MARKER SKIN DUAL TIP RULER LAB (MISCELLANEOUS) IMPLANT
NDL SAFETY ECLIPSE 18X1.5 (NEEDLE) IMPLANT
NEEDLE HYPO 18GX1.5 SHARP (NEEDLE)
NEEDLE HYPO 25X1 1.5 SAFETY (NEEDLE) ×2 IMPLANT
PENCIL SMOKE EVACUATOR (MISCELLANEOUS) ×2 IMPLANT
PIN SAFETY STERILE (MISCELLANEOUS) IMPLANT
PUNCH BIOPSY DERMAL 4MM (MISCELLANEOUS) IMPLANT
SHEET MEDIUM DRAPE 40X70 STRL (DRAPES) ×2 IMPLANT
SLEEVE SCD COMPRESS KNEE MED (MISCELLANEOUS) ×2 IMPLANT
SPONGE LAP 18X18 RF (DISPOSABLE) ×4 IMPLANT
STAPLER VISISTAT 35W (STAPLE) ×2 IMPLANT
SUT CHROMIC 4 0 PS 2 18 (SUTURE) ×6 IMPLANT
SUT ETHILON 2 0 FS 18 (SUTURE) ×2 IMPLANT
SUT MNCRL AB 4-0 PS2 18 (SUTURE) ×2 IMPLANT
SUT PDS AB 2-0 CT2 27 (SUTURE) IMPLANT
SUT SILK 2 0 SH (SUTURE) IMPLANT
SUT VIC AB 3-0 SH 27 (SUTURE)
SUT VIC AB 3-0 SH 27X BRD (SUTURE) IMPLANT
SUT VICRYL 0 CT-2 (SUTURE) ×2 IMPLANT
SUT VICRYL 4-0 PS2 18IN ABS (SUTURE) ×2 IMPLANT
SUT VLOC 180 0 24IN GS25 (SUTURE) ×2 IMPLANT
SYR 10ML LL (SYRINGE) IMPLANT
SYR 50ML LL SCALE MARK (SYRINGE) IMPLANT
SYR BULB IRRIG 60ML STRL (SYRINGE) ×2 IMPLANT
SYR CONTROL 10ML LL (SYRINGE) ×2 IMPLANT
TAPE MEASURE VINYL STERILE (MISCELLANEOUS) IMPLANT
TISSUE EXPNDR FV FOURTE 400 (Prosthesis & Implant Plastic) ×2 IMPLANT
TOWEL GREEN STERILE FF (TOWEL DISPOSABLE) ×2 IMPLANT
TRAY DSU PREP LF (CUSTOM PROCEDURE TRAY) ×2 IMPLANT
TRAY FOLEY W/BAG SLVR 14FR LF (SET/KITS/TRAYS/PACK) IMPLANT
TUBE CONNECTING 20X1/4 (TUBING) ×2 IMPLANT
UNDERPAD 30X36 HEAVY ABSORB (UNDERPADS AND DIAPERS) ×4 IMPLANT
YANKAUER SUCT BULB TIP NO VENT (SUCTIONS) ×2 IMPLANT

## 2020-01-29 NOTE — Anesthesia Postprocedure Evaluation (Signed)
Anesthesia Post Note  Patient: [name]  Procedure(s) Performed: RIGHT BREAST RECONSTRUCTION WITH PLACEMENT OF TISSUE EXPANDER AND ALLODERM (Right Breast)     Patient location during evaluation: PACU Anesthesia Type: General Level of consciousness: awake and alert and oriented Pain management: pain level controlled Vital Signs Assessment: post-procedure vital signs reviewed and stable Respiratory status: spontaneous breathing, nonlabored ventilation and respiratory function stable Cardiovascular status: blood pressure returned to baseline and stable Postop Assessment: no apparent nausea or vomiting Anesthetic complications: no   No complications documented.  Last Vitals:  Vitals:   01/29/20 1000 01/29/20 1009  BP:    Pulse: 66 72  Resp: 12 13  Temp:    SpO2: 100% 96%    Last Pain:  Vitals:   01/29/20 1009  TempSrc:   PainSc: 4                  Javien Tesch A.

## 2020-01-29 NOTE — Anesthesia Procedure Notes (Signed)
Procedure Name: Intubation Date/Time: 01/29/2020 7:26 AM Performed by: Maryella Shivers, CRNA Pre-anesthesia Checklist: Patient identified, Emergency Drugs available, Suction available and Patient being monitored Patient Re-evaluated:Patient Re-evaluated prior to induction Oxygen Delivery Method: Circle system utilized Preoxygenation: Pre-oxygenation with 100% oxygen Induction Type: IV induction Ventilation: Mask ventilation without difficulty Laryngoscope Size: Mac and 3 Grade View: Grade I Tube type: Oral Tube size: 7.0 mm Number of attempts: 1 Airway Equipment and Method: Stylet and Oral airway Placement Confirmation: ETT inserted through vocal cords under direct vision,  positive ETCO2 and breath sounds checked- equal and bilateral Secured at: 21 cm Tube secured with: Tape Dental Injury: Teeth and Oropharynx as per pre-operative assessment

## 2020-01-29 NOTE — Op Note (Signed)
Operative Note   DATE OF OPERATION: 7.13.21  LOCATION: [facility]-outpatient  SURGICAL DIVISION: Plastic Surgery  PREOPERATIVE DIAGNOSES:  1. Acquired absence breasts 2. 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 Family history breast cancer  POSTOPERATIVE DIAGNOSES:  same  PROCEDURE:  1. Right breast reconstruction with tissue expander 2. Acellular dermis (Alloderm) to right chest 300 cm2  SURGEON: Irene Limbo MD MBA  ASSISTANT: none  ANESTHESIA:  General.   EBL: 30 ml  COMPLICATIONS: None immediate.   INDICATIONS FOR PROCEDURE:  The patient, Christina Baldwin, is a 44 y.o. female born on Jul 03, 1976, is here for delayed expander based right prepectoral breast reconstruction. She is post operative from bilateral skin reduction pattern mastectomies with immediate expander placement. Course complicated by right chest infection and removal expander.   FINDINGS: Natrelle 133S FV-12-T 400 ml tissue expander placed, initial fill volume 270 ml saline. SN [id number]  DESCRIPTION OF PROCEDURE:  The patient's operative site was marked with the patient in the preoperative area.The patientwas taken to the operating room. SCDs were placed and IV antibiotics were given. The patient's operative site was prepped and draped in a sterile fashion. A time out was performed and all information was confirmed to be correct.   Incision made in priorinframammary foldmastectomy scar and skin flaps elevated off pectoralis to dimensions of expander. Local anestheticinfiltrated. Cavity irrigated with saline solution containing Ancef, gentamicin and bacitracin. Hemostasis obtained. 15 Fr JP placed along inframammary fold and 19 Fr JP along lateral border. Both secured 2-0 nylon. Cavity irrigated with saline Betadine solution. The tissue expanderwasprepared on back table prior in insertion. The expander was filled with air.Perforated acellular dermis was draped over anterior surface expander. The ADM was  then secured to itself over posterior surface of expanderwith 4-0 chromic. Redundant folds acellular dermis excised so that the ADM layflat without folds over air filled expander.The expander was secured to medial insertion pectoralis with a 2-0 PDS.The lateral tab also secured to serratus muscle with 2-0 PDS. The ADM was secured to pectoralis muscle and chest wall along inferior border at inframammary foldwith 0 V lock suture. Skin closure completedwith 3-0 vicryl in fascial layer and 4-0 vicryl in dermis. Skin closure completed with 4-0 monocryl subcuticular and tissue adhesive. The expander port was accessed and all air removed. Saline used to fill expander to initial fill volume 270 ml. Dermabond applied followed by Tegadems and dry dressing. Breast binder applied.   The patient was allowed to wake from anesthesia, extubated and taken to the recovery room in satisfactory condition.   SPECIMENS: none  DRAINS: 15 and 19 fr JP in right breast reconstruction

## 2020-01-29 NOTE — Transfer of Care (Signed)
Immediate Anesthesia Transfer of Care Note  Patient: Christina Baldwin  Procedure(s) Performed: RIGHT BREAST RECONSTRUCTION WITH PLACEMENT OF TISSUE EXPANDER AND ALLODERM (Right Breast)  Patient Location: PACU  Anesthesia Type:General  Level of Consciousness: awake, alert  and oriented  Airway & Oxygen Therapy: Patient Spontanous Breathing and Patient connected to face mask oxygen  Post-op Assessment: Report given to RN and Post -op Vital signs reviewed and stable  Post vital signs: Reviewed and stable  Last Vitals:  Vitals Value Taken Time  BP 125/72 01/29/20 0938  Temp    Pulse 93 01/29/20 0940  Resp    SpO2 100 % 01/29/20 0940  Vitals shown include unvalidated device data.  Last Pain:  Vitals:   01/29/20 0652  TempSrc: Oral  PainSc: 0-No pain         Complications: No complications documented.

## 2020-01-29 NOTE — Interval H&P Note (Signed)
History and Physical Interval Note:  01/29/2020 6:53 AM  [place]  has presented today for surgery, with the diagnosis of acquired absence breasts, high risk breast cancer.  The various methods of treatment have been discussed with the patient and family. After consideration of risks, benefits and other options for treatment, the patient has consented to  Procedure(s): RIGHT BREAST RECONSTRUCTION WITH PLACEMENT OF TISSUE EXPANDER AND ALLODERM (Right) as a surgical intervention.  The patient's history has been reviewed, patient examined, no change in status, stable for surgery.  I have reviewed the patient's chart and labs.  Questions were answered to the patient's satisfaction.     [name] Brittannie Tawney

## 2020-01-29 NOTE — Discharge Instructions (Signed)
NO TYLENOL PRODUCTS UNTIL 12:50  NO ADVIL/ MOTRIN UNTIL 12:50    Post Anesthesia Home Care Instructions  Activity: Get plenty of rest for the remainder of the day. A responsible individual must stay with you for 24 hours following the procedure.  For the next 24 hours, DO NOT: -Drive a car -Paediatric nurse -Drink alcoholic beverages -Take any medication unless instructed by your physician -Make any legal decisions or sign important papers.  Meals: Start with liquid foods such as gelatin or soup. Progress to regular foods as tolerated. Avoid greasy, spicy, heavy foods. If nausea and/or vomiting occur, drink only clear liquids until the nausea and/or vomiting subsides. Call your physician if vomiting continues.  Special Instructions/Symptoms: Your throat may feel dry or sore from the anesthesia or the breathing tube placed in your throat during surgery. If this causes discomfort, gargle with warm salt water. The discomfort should disappear within 24 hours.  If you had a scopolamine patch placed behind your ear for the management of post- operative nausea and/or vomiting:  1. The medication in the patch is effective for 72 hours, after which it should be removed.  Wrap patch in a tissue and discard in the trash. Wash hands thoroughly with soap and water. 2. You may remove the patch earlier than 72 hours if you experience unpleasant side effects which may include dry mouth, dizziness or visual disturbances. 3. Avoid touching the patch. Wash your hands with soap and water after contact with the patch.    About my Jackson-Pratt Bulb Drain  What is a Jackson-Pratt bulb? A Jackson-Pratt is a soft, round device used to collect drainage. It is connected to a long, thin drainage catheter, which is held in place by one or two small stiches near your surgical incision site. When the bulb is squeezed, it forms a vacuum, forcing the drainage to empty into the bulb.  Emptying the Jackson-Pratt  bulb- To empty the bulb: 1. Release the plug on the top of the bulb. 2. Pour the bulb's contents into a measuring container which your nurse will provide. 3. Record the time emptied and amount of drainage. Empty the drain(s) as often as your     doctor or nurse recommends.  Date                  Time                    Amount (Drain 1)                 Amount (Drain 2)  _____________________________________________________________________  _____________________________________________________________________  _____________________________________________________________________  _____________________________________________________________________  _____________________________________________________________________  _____________________________________________________________________  _____________________________________________________________________  _____________________________________________________________________  Squeezing the Jackson-Pratt Bulb- To squeeze the bulb: 1. Make sure the plug at the top of the bulb is open. 2. Squeeze the bulb tightly in your fist. You will hear air squeezing from the bulb. 3. Replace the plug while the bulb is squeezed. 4. Use a safety pin to attach the bulb to your clothing. This will keep the catheter from     pulling at the bulb insertion site.  When to call your doctor- Call your doctor if:  Drain site becomes red, swollen or hot.  You have a fever greater than 101 degrees F.  There is oozing at the drain site.  Drain falls out (apply a guaze bandage over the drain hole and secure it with tape).  Drainage increases daily not related to activity patterns. (You will usually have more  drainage when you are active than when you are resting.)  Drainage has a bad odor.

## 2020-01-30 ENCOUNTER — Encounter (HOSPITAL_BASED_OUTPATIENT_CLINIC_OR_DEPARTMENT_OTHER): Payer: Self-pay | Admitting: Plastic Surgery

## 2020-02-20 ENCOUNTER — Encounter (HOSPITAL_BASED_OUTPATIENT_CLINIC_OR_DEPARTMENT_OTHER): Payer: Self-pay | Admitting: Plastic Surgery

## 2020-02-20 ENCOUNTER — Other Ambulatory Visit: Payer: Self-pay

## 2020-02-20 ENCOUNTER — Other Ambulatory Visit (HOSPITAL_COMMUNITY)
Admission: RE | Admit: 2020-02-20 | Discharge: 2020-02-20 | Disposition: A | Discharge status: Home / Self Care | Payer: Managed Care, Other (non HMO) | Source: Ambulatory Visit | Attending: Plastic Surgery | Admitting: Plastic Surgery

## 2020-02-20 DIAGNOSIS — Z20822 Contact with and (suspected) exposure to covid-19: Secondary | ICD-10-CM | POA: Insufficient documentation

## 2020-02-20 DIAGNOSIS — Z01812 Encounter for preprocedural laboratory examination: Secondary | ICD-10-CM | POA: Diagnosis not present

## 2020-02-20 LAB — SARS CORONAVIRUS 2 (TAT 6-24 HRS): SARS Coronavirus 2: NEGATIVE

## 2020-02-20 NOTE — H&P (Signed)
  Subjective:     Patient ID: Christina Baldwin is a 44 y.o. female.  HPI  3 weeks post op replacement right TE. Denies fever, notes onset redness following drain removal week ago that waxes and wanes.  First MMG 2020 which showed 2 areas of concern in the right breast upper quadrant. 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 Family history breast ca in maternal aunt and cousin. Met with Dr. Lindi Adie who counseled approximately 24% lifetime risk of breast cancer by the [facility] model. Given likely pathogenic genetic mutation as below, risk felt to be overall higher than this.   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  MRI 09/2019 normal  Underwent bilateral SRM with immediate expander ADM reconstruction.Final pathology benign breast tissue bilateral. Course complicated by onset fever 2 weeks postoperative with cellulitis right chest and ultimate removal right TE/ADM, culture few Proteus Mirabilis, completed Cipro course.   Prior D/DD. Right mastectomy 772 g Left 554 g  PMH significant for pre diabetes on metformin, last HbA1c 5.4 03/2019. History seizure d/o- last over 10 years ago, no current medication.  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 Has aunt in [place] that would assist with post op care.  Review of Systems     Objective:   Physical Exam  Cardiovascular: Normal rate, regular rhythm and normal heart sounds.  Pulmonary/Chest: Effort normal and breath sounds normal.    Chest: left chest soft expanded,  right chest  incision intact with edema and erythema, over medial upper right expander area ballotable  Assessment:     High risk breast cancer Family history breast cancer S/p bilateral SRM, prepectoral TE/ADM  (Alloderm) reconstruction S/p removal right TE S/p replacement right prepectoral TE/ADM (Alloderm)    Plan:     [place] completed with fluid collection noted over anterior superior TE. Area prepped with CHG. With 25 Ga needle able to aspirate approximately 60 ml milky tan fluid. Culture sent. Augmentin prescribed.   Counseled patient appearance fluid suggests infection. I suspect some areas of non incorporating ADM contributing to this. In setting of prosthetic material and absence of systemic symptoms such as fever, recommend wash out of cavity and new prosthesis removal non incorporated ADM. As patient is expanded, reviewed options placement implant vs new expander. We have agreed to proceed with attempt salvage reconstruction with silicone implant exchange. Provided Inspira hand out for review. Discussed imaging surveillance with silicone implants, may offer less rippling and maintain shape over saline implants. She has agreed to silicone and plan smooth round capacity filled. Will plan drain placement over right. Reviewed risk that this procedure does not clear infection and she may require additional surgery to remove implant.  Counseled patient if develops any systemic symptoms such as fever between now and surgery, then plan will be to remove expander right.  Plan OR in 2 days.  Natrelle 133S-FV-12-T 400 ml tissue expanders placed bilateral,  LEFT fill volume 410 ml saline RIGHT fill volume 365 ml saline .

## 2020-02-21 ENCOUNTER — Encounter (HOSPITAL_BASED_OUTPATIENT_CLINIC_OR_DEPARTMENT_OTHER)
Admission: RE | Admit: 2020-02-21 | Discharge: 2020-02-21 | Disposition: A | Discharge status: Home / Self Care | Payer: Managed Care, Other (non HMO) | Source: Ambulatory Visit | Attending: Plastic Surgery | Admitting: Plastic Surgery

## 2020-02-21 DIAGNOSIS — Z01812 Encounter for preprocedural laboratory examination: Secondary | ICD-10-CM | POA: Diagnosis not present

## 2020-02-21 LAB — BASIC METABOLIC PANEL
Anion gap: 11 (ref 5–15)
BUN: 15 mg/dL (ref 6–20)
CO2: 24 mmol/L (ref 22–32)
Calcium: 9.6 mg/dL (ref 8.9–10.3)
Chloride: 100 mmol/L (ref 98–111)
Creatinine, Ser: 0.84 mg/dL (ref 0.44–1.00)
GFR calc Af Amer: >60 mL/min (ref >60)
GFR calc non Af Amer: >60 mL/min (ref >60)
Glucose, Bld: 138 mg/dL — ABNORMAL HIGH (ref 70–99)
Potassium: 3.7 mmol/L (ref 3.5–5.1)
Sodium: 135 mmol/L (ref 135–145)

## 2020-02-21 LAB — POCT PREGNANCY, URINE: Preg Test, Ur: NEGATIVE

## 2020-02-21 NOTE — Progress Notes (Addendum)

## 2020-02-22 ENCOUNTER — Other Ambulatory Visit: Payer: Self-pay

## 2020-02-22 ENCOUNTER — Ambulatory Visit (HOSPITAL_BASED_OUTPATIENT_CLINIC_OR_DEPARTMENT_OTHER)
Admission: RE | Admit: 2020-02-22 | Discharge: 2020-02-22 | Disposition: A | Discharge status: Home / Self Care | Payer: Managed Care, Other (non HMO) | Attending: Plastic Surgery | Admitting: Plastic Surgery

## 2020-02-22 ENCOUNTER — Ambulatory Visit (HOSPITAL_BASED_OUTPATIENT_CLINIC_OR_DEPARTMENT_OTHER): Payer: Managed Care, Other (non HMO) | Admitting: Certified Registered"

## 2020-02-22 ENCOUNTER — Encounter (HOSPITAL_BASED_OUTPATIENT_CLINIC_OR_DEPARTMENT_OTHER): Payer: Self-pay | Admitting: Plastic Surgery

## 2020-02-22 ENCOUNTER — Encounter (HOSPITAL_BASED_OUTPATIENT_CLINIC_OR_DEPARTMENT_OTHER)
Admission: RE | Disposition: A | Discharge status: Home / Self Care | Payer: Self-pay | Source: Home / Self Care | Attending: Plastic Surgery

## 2020-02-22 DIAGNOSIS — T8149XA Infection following a procedure, other surgical site, initial encounter: Secondary | ICD-10-CM | POA: Diagnosis not present

## 2020-02-22 DIAGNOSIS — R7303 Prediabetes: Secondary | ICD-10-CM | POA: Diagnosis not present

## 2020-02-22 DIAGNOSIS — Z45811 Encounter for adjustment or removal of right breast implant: Secondary | ICD-10-CM | POA: Diagnosis not present

## 2020-02-22 DIAGNOSIS — G40909 Epilepsy, unspecified, not intractable, without status epilepticus: Secondary | ICD-10-CM | POA: Insufficient documentation

## 2020-02-22 DIAGNOSIS — A4901 Methicillin susceptible Staphylococcus aureus infection, unspecified site: Secondary | ICD-10-CM | POA: Diagnosis not present

## 2020-02-22 DIAGNOSIS — Y812 Prosthetic and other implants, materials and accessory general- and plastic-surgery devices associated with adverse incidents: Secondary | ICD-10-CM | POA: Diagnosis not present

## 2020-02-22 DIAGNOSIS — Z9013 Acquired absence of bilateral breasts and nipples: Secondary | ICD-10-CM | POA: Insufficient documentation

## 2020-02-22 DIAGNOSIS — L7682 Other postprocedural complications of skin and subcutaneous tissue: Secondary | ICD-10-CM | POA: Diagnosis not present

## 2020-02-22 DIAGNOSIS — Z803 Family history of malignant neoplasm of breast: Secondary | ICD-10-CM | POA: Insufficient documentation

## 2020-02-22 DIAGNOSIS — I10 Essential (primary) hypertension: Secondary | ICD-10-CM | POA: Diagnosis not present

## 2020-02-22 DIAGNOSIS — L03313 Cellulitis of chest wall: Secondary | ICD-10-CM | POA: Diagnosis not present

## 2020-02-22 DIAGNOSIS — Z45812 Encounter for adjustment or removal of left breast implant: Secondary | ICD-10-CM | POA: Insufficient documentation

## 2020-02-22 DIAGNOSIS — Z1501 Genetic susceptibility to malignant neoplasm of breast: Secondary | ICD-10-CM | POA: Insufficient documentation

## 2020-02-22 HISTORY — PX: REMOVAL OF TISSUE EXPANDER AND PLACEMENT OF IMPLANT: SHX6457

## 2020-02-22 LAB — GLUCOSE, CAPILLARY
Glucose-Capillary: 98 mg/dL (ref 70–99)
Glucose-Capillary: 114 mg/dL — ABNORMAL HIGH (ref 70–99)

## 2020-02-22 SURGERY — REMOVAL, TISSUE EXPANDER, BREAST, WITH IMPLANT INSERTION
Anesthesia: General | Site: Breast | Laterality: Bilateral

## 2020-02-22 MED ORDER — DEXAMETHASONE SODIUM PHOSPHATE 10 MG/ML IJ SOLN
INTRAMUSCULAR | Status: AC
  Filled 2020-02-22: qty 1

## 2020-02-22 MED ORDER — SODIUM CHLORIDE 0.9 % IV SOLN
INTRAVENOUS | Status: DC | PRN
  Administered 2020-02-22: 1000 mL

## 2020-02-22 MED ORDER — OXYCODONE HCL 5 MG PO TABS
ORAL_TABLET | ORAL | Status: AC
  Filled 2020-02-22: qty 1

## 2020-02-22 MED ORDER — PHENYLEPHRINE 40 MCG/ML (10ML) SYRINGE FOR IV PUSH (FOR BLOOD PRESSURE SUPPORT)
PREFILLED_SYRINGE | INTRAVENOUS | Status: DC | PRN
  Administered 2020-02-22 (×2): 80 ug via INTRAVENOUS

## 2020-02-22 MED ORDER — MIDAZOLAM HCL 5 MG/5ML IJ SOLN
INTRAMUSCULAR | Status: DC | PRN
  Administered 2020-02-22: 2 mg via INTRAVENOUS

## 2020-02-22 MED ORDER — LIDOCAINE 2% (20 MG/ML) 5 ML SYRINGE
INTRAMUSCULAR | Status: DC | PRN
  Administered 2020-02-22: 100 mg via INTRAVENOUS

## 2020-02-22 MED ORDER — ACETAMINOPHEN 500 MG PO TABS
1000.0000 mg | ORAL_TABLET | ORAL | Status: AC
  Administered 2020-02-22: 1000 mg via ORAL

## 2020-02-22 MED ORDER — FENTANYL CITRATE (PF) 100 MCG/2ML IJ SOLN
INTRAMUSCULAR | Status: AC
  Filled 2020-02-22: qty 2

## 2020-02-22 MED ORDER — POVIDONE-IODINE 10 % EX SOLN
CUTANEOUS | Status: DC | PRN
  Administered 2020-02-22: 1 via TOPICAL

## 2020-02-22 MED ORDER — ROCURONIUM BROMIDE 100 MG/10ML IV SOLN
INTRAVENOUS | Status: DC | PRN
  Administered 2020-02-22: 100 mg via INTRAVENOUS

## 2020-02-22 MED ORDER — CELECOXIB 200 MG PO CAPS
ORAL_CAPSULE | ORAL | Status: AC
  Filled 2020-02-22: qty 1

## 2020-02-22 MED ORDER — PROPOFOL 500 MG/50ML IV EMUL
INTRAVENOUS | Status: DC | PRN
  Administered 2020-02-22: 25 ug/kg/min via INTRAVENOUS

## 2020-02-22 MED ORDER — OXYCODONE HCL 5 MG PO TABS: 5.0000 mg | ORAL_TABLET | ORAL | 0 refills | Status: DC | PRN

## 2020-02-22 MED ORDER — PHENYLEPHRINE 40 MCG/ML (10ML) SYRINGE FOR IV PUSH (FOR BLOOD PRESSURE SUPPORT)
PREFILLED_SYRINGE | INTRAVENOUS | Status: AC
  Filled 2020-02-22: qty 20

## 2020-02-22 MED ORDER — HYDROMORPHONE HCL 1 MG/ML IJ SOLN
INTRAMUSCULAR | Status: AC
  Filled 2020-02-22: qty 0.5

## 2020-02-22 MED ORDER — SUGAMMADEX SODIUM 500 MG/5ML IV SOLN
INTRAVENOUS | Status: AC
  Filled 2020-02-22: qty 5

## 2020-02-22 MED ORDER — 0.9 % SODIUM CHLORIDE (POUR BTL) OPTIME
TOPICAL | Status: DC | PRN
  Administered 2020-02-22: 2000 mL

## 2020-02-22 MED ORDER — GABAPENTIN 300 MG PO CAPS
ORAL_CAPSULE | ORAL | Status: AC
  Filled 2020-02-22: qty 1

## 2020-02-22 MED ORDER — METHOCARBAMOL 500 MG PO TABS: 500.0000 mg | ORAL_TABLET | Freq: Three times a day (TID) | ORAL | 0 refills | Status: DC | PRN

## 2020-02-22 MED ORDER — ROCURONIUM BROMIDE 10 MG/ML (PF) SYRINGE
PREFILLED_SYRINGE | INTRAVENOUS | Status: AC
  Filled 2020-02-22: qty 10

## 2020-02-22 MED ORDER — MIDAZOLAM HCL 2 MG/2ML IJ SOLN
INTRAMUSCULAR | Status: AC
  Filled 2020-02-22: qty 2

## 2020-02-22 MED ORDER — GABAPENTIN 300 MG PO CAPS
300.0000 mg | ORAL_CAPSULE | ORAL | Status: AC
  Administered 2020-02-22: 300 mg via ORAL

## 2020-02-22 MED ORDER — PROPOFOL 10 MG/ML IV BOLUS
INTRAVENOUS | Status: AC
  Filled 2020-02-22: qty 20

## 2020-02-22 MED ORDER — CELECOXIB 200 MG PO CAPS
200.0000 mg | ORAL_CAPSULE | ORAL | Status: AC
  Administered 2020-02-22: 200 mg via ORAL

## 2020-02-22 MED ORDER — SUGAMMADEX SODIUM 200 MG/2ML IV SOLN
INTRAVENOUS | Status: DC | PRN
  Administered 2020-02-22: 200 mg via INTRAVENOUS

## 2020-02-22 MED ORDER — FENTANYL CITRATE (PF) 100 MCG/2ML IJ SOLN
INTRAMUSCULAR | Status: DC | PRN
  Administered 2020-02-22: 50 ug via INTRAVENOUS
  Administered 2020-02-22 (×3): 25 ug via INTRAVENOUS
  Administered 2020-02-22: 50 ug via INTRAVENOUS
  Administered 2020-02-22: 25 ug via INTRAVENOUS

## 2020-02-22 MED ORDER — CHLORHEXIDINE GLUCONATE CLOTH 2 % EX PADS: 6.0000 | MEDICATED_PAD | Freq: Once | CUTANEOUS | Status: DC

## 2020-02-22 MED ORDER — PROPOFOL 500 MG/50ML IV EMUL
INTRAVENOUS | Status: AC
  Filled 2020-02-22: qty 50

## 2020-02-22 MED ORDER — VANCOMYCIN HCL IN DEXTROSE 1-5 GM/200ML-% IV SOLN
INTRAVENOUS | Status: AC
  Filled 2020-02-22: qty 200

## 2020-02-22 MED ORDER — HYDROMORPHONE HCL 1 MG/ML IJ SOLN
0.2500 mg | INTRAMUSCULAR | Status: DC | PRN
  Administered 2020-02-22 (×3): 0.5 mg via INTRAVENOUS

## 2020-02-22 MED ORDER — ONDANSETRON HCL 4 MG/2ML IJ SOLN
INTRAMUSCULAR | Status: AC
  Filled 2020-02-22: qty 2

## 2020-02-22 MED ORDER — OXYCODONE HCL 5 MG PO TABS
5.0000 mg | ORAL_TABLET | ORAL | Status: AC
  Administered 2020-02-22: 5 mg via ORAL

## 2020-02-22 MED ORDER — VANCOMYCIN HCL IN DEXTROSE 1-5 GM/200ML-% IV SOLN
1000.0000 mg | INTRAVENOUS | Status: AC
  Administered 2020-02-22: 1000 mg via INTRAVENOUS

## 2020-02-22 MED ORDER — ACETAMINOPHEN 500 MG PO TABS
ORAL_TABLET | ORAL | Status: AC
  Filled 2020-02-22: qty 2

## 2020-02-22 MED ORDER — DEXAMETHASONE SODIUM PHOSPHATE 10 MG/ML IJ SOLN
INTRAMUSCULAR | Status: DC | PRN
  Administered 2020-02-22: 5 mg via INTRAVENOUS

## 2020-02-22 MED ORDER — MEPERIDINE HCL 25 MG/ML IJ SOLN: 6.2500 mg | INTRAMUSCULAR | Status: DC | PRN

## 2020-02-22 MED ORDER — PROPOFOL 10 MG/ML IV BOLUS
INTRAVENOUS | Status: DC | PRN
  Administered 2020-02-22: 140 mg via INTRAVENOUS

## 2020-02-22 MED ORDER — ONDANSETRON HCL 4 MG/2ML IJ SOLN: 4.0000 mg | Freq: Once | INTRAMUSCULAR | Status: DC | PRN

## 2020-02-22 MED ORDER — ONDANSETRON HCL 4 MG/2ML IJ SOLN
INTRAMUSCULAR | Status: DC | PRN
  Administered 2020-02-22: 4 mg via INTRAVENOUS

## 2020-02-22 MED ORDER — LACTATED RINGERS IV SOLN: INTRAVENOUS | Status: DC

## 2020-02-22 SURGICAL SUPPLY — 81 items
ADH SKN CLS APL DERMABOND .7 (GAUZE/BANDAGES/DRESSINGS) ×2
APL PRP STRL LF DISP 70% ISPRP (MISCELLANEOUS) ×1
BAG DECANTER FOR FLEXI CONT (MISCELLANEOUS) ×2 IMPLANT
BINDER BREAST LRG (GAUZE/BANDAGES/DRESSINGS) IMPLANT
BINDER BREAST MEDIUM (GAUZE/BANDAGES/DRESSINGS) IMPLANT
BINDER BREAST XLRG (GAUZE/BANDAGES/DRESSINGS) IMPLANT
BINDER BREAST XXLRG (GAUZE/BANDAGES/DRESSINGS) ×2 IMPLANT
BIOPATCH RED 1 DISK 7.0 (GAUZE/BANDAGES/DRESSINGS) ×2 IMPLANT
BLADE SURG 10 STRL SS (BLADE) ×2 IMPLANT
BLADE SURG 15 STRL LF DISP TIS (BLADE) ×1 IMPLANT
BLADE SURG 15 STRL SS (BLADE) ×2
BNDG GAUZE ELAST 4 BULKY (GAUZE/BANDAGES/DRESSINGS) ×4 IMPLANT
CANISTER SUCT 1200ML W/VALVE (MISCELLANEOUS) ×8 IMPLANT
CHLORAPREP W/TINT 26 (MISCELLANEOUS) ×2 IMPLANT
COVER BACK TABLE 60X90IN (DRAPES) ×2 IMPLANT
COVER MAYO STAND STRL (DRAPES) ×2 IMPLANT
COVER WAND RF STERILE (DRAPES) IMPLANT
DECANTER SPIKE VIAL GLASS SM (MISCELLANEOUS) IMPLANT
DERMABOND ADVANCED (GAUZE/BANDAGES/DRESSINGS) ×2
DERMABOND ADVANCED .7 DNX12 (GAUZE/BANDAGES/DRESSINGS) ×2 IMPLANT
DRAIN CHANNEL 15F RND FF W/TCR (WOUND CARE) IMPLANT
DRAIN CHANNEL 19F RND (DRAIN) ×2 IMPLANT
DRAPE TOP ARMCOVERS (MISCELLANEOUS) ×2 IMPLANT
DRAPE U-SHAPE 76X120 STRL (DRAPES) ×4 IMPLANT
DRAPE UTILITY XL STRL (DRAPES) ×4 IMPLANT
DRSG PAD ABDOMINAL 8X10 ST (GAUZE/BANDAGES/DRESSINGS) ×4 IMPLANT
DRSG TEGADERM 2-3/8X2-3/4 SM (GAUZE/BANDAGES/DRESSINGS) ×2 IMPLANT
ELECT BLADE 4.0 EZ CLEAN MEGAD (MISCELLANEOUS) ×2
ELECT COATED BLADE 2.86 ST (ELECTRODE) ×2 IMPLANT
ELECT REM PT RETURN 9FT ADLT (ELECTROSURGICAL) ×2
ELECTRODE BLDE 4.0 EZ CLN MEGD (MISCELLANEOUS) ×1 IMPLANT
ELECTRODE REM PT RTRN 9FT ADLT (ELECTROSURGICAL) ×1 IMPLANT
EVACUATOR SILICONE 100CC (DRAIN) ×2 IMPLANT
GLOVE BIO SURGEON STRL SZ 6 (GLOVE) ×8 IMPLANT
GOWN STRL REUS W/ TWL LRG LVL3 (GOWN DISPOSABLE) ×3 IMPLANT
GOWN STRL REUS W/TWL LRG LVL3 (GOWN DISPOSABLE) ×6
IMPL BREAST P6.4XRND XFULL 580 (Breast) ×1 IMPLANT
IMPL BREAST SILICONE 525CC (Breast) ×1 IMPLANT
IMPL BRST P6.4XRND XFULL 580CC (Breast) ×1 IMPLANT
IMPLANT BREAST GEL 580CC (Breast) ×2 IMPLANT
IMPLANT BREAST SILICONE 525CC (Breast) ×2 IMPLANT
IV NS 500ML (IV SOLUTION)
IV NS 500ML BAXH (IV SOLUTION) IMPLANT
KIT FILL SYSTEM UNIVERSAL (SET/KITS/TRAYS/PACK) IMPLANT
MARKER SKIN DUAL TIP RULER LAB (MISCELLANEOUS) IMPLANT
NEEDLE FILTER BLUNT 18X 1/2SAF (NEEDLE) ×1
NEEDLE FILTER BLUNT 18X1 1/2 (NEEDLE) ×1 IMPLANT
NEEDLE HYPO 25X1 1.5 SAFETY (NEEDLE) IMPLANT
PACK BASIN DAY SURGERY FS (CUSTOM PROCEDURE TRAY) ×2 IMPLANT
PENCIL SMOKE EVACUATOR (MISCELLANEOUS) ×2 IMPLANT
PIN SAFETY STERILE (MISCELLANEOUS) ×2 IMPLANT
SHEET MEDIUM DRAPE 40X70 STRL (DRAPES) ×4 IMPLANT
SIZER BREAST REUSE GEL 525CC (SIZER) ×2
SIZER BREAST REUSE GEL 545CC (SIZER) ×2
SIZER BREAST REUSE GEL 580CC (SIZER) ×2
SIZER BRST REUSE GEL 525CC (SIZER) ×1 IMPLANT
SIZER BRST REUSE GEL 545CC (SIZER) ×1 IMPLANT
SIZER BRST REUSE GEL 580CC (SIZER) ×1 IMPLANT
SLEEVE SCD COMPRESS KNEE MED (MISCELLANEOUS) ×2 IMPLANT
SPONGE LAP 18X18 RF (DISPOSABLE) ×4 IMPLANT
STAPLER VISISTAT 35W (STAPLE) ×2 IMPLANT
STRIP CLOSURE SKIN 1/2X4 (GAUZE/BANDAGES/DRESSINGS) ×4 IMPLANT
SUT ETHILON 2 0 FS 18 (SUTURE) ×2 IMPLANT
SUT MNCRL AB 4-0 PS2 18 (SUTURE) ×4 IMPLANT
SUT MON AB 3-0 SH 27 (SUTURE) ×2
SUT MON AB 3-0 SH27 (SUTURE) ×1 IMPLANT
SUT PDS AB 2-0 CT2 27 (SUTURE) IMPLANT
SUT SILK 2 0 SH (SUTURE) IMPLANT
SUT VIC AB 3-0 PS1 18 (SUTURE)
SUT VIC AB 3-0 PS1 18XBRD (SUTURE) IMPLANT
SUT VIC AB 3-0 SH 27 (SUTURE) ×2
SUT VIC AB 3-0 SH 27X BRD (SUTURE) ×1 IMPLANT
SUT VICRYL 4-0 PS2 18IN ABS (SUTURE) ×2 IMPLANT
SYR 20ML LL LF (SYRINGE) IMPLANT
SYR BULB EAR ULCER 3OZ GRN STR (SYRINGE) ×6 IMPLANT
SYR BULB IRRIG 60ML STRL (SYRINGE) IMPLANT
SYR CONTROL 10ML LL (SYRINGE) IMPLANT
TOWEL GREEN STERILE FF (TOWEL DISPOSABLE) ×4 IMPLANT
TUBE CONNECTING 20X1/4 (TUBING) ×4 IMPLANT
UNDERPAD 30X36 HEAVY ABSORB (UNDERPADS AND DIAPERS) ×4 IMPLANT
YANKAUER SUCT BULB TIP NO VENT (SUCTIONS) ×2 IMPLANT

## 2020-02-22 NOTE — Discharge Instructions (Signed)
No Tylenol or Ibuprofen until 7:00pm if needed.   Post Anesthesia Home Care Instructions  Activity: Get plenty of rest for the remainder of the day. A responsible individual must stay with you for 24 hours following the procedure.  For the next 24 hours, DO NOT: -Drive a car -Paediatric nurse -Drink alcoholic beverages -Take any medication unless instructed by your physician -Make any legal decisions or sign important papers.  Meals: Start with liquid foods such as gelatin or soup. Progress to regular foods as tolerated. Avoid greasy, spicy, heavy foods. If nausea and/or vomiting occur, drink only clear liquids until the nausea and/or vomiting subsides. Call your physician if vomiting continues.  Special Instructions/Symptoms: Your throat may feel dry or sore from the anesthesia or the breathing tube placed in your throat during surgery. If this causes discomfort, gargle with warm salt water. The discomfort should disappear within 24 hours.  If you had a scopolamine patch placed behind your ear for the management of post- operative nausea and/or vomiting:  1. The medication in the patch is effective for 72 hours, after which it should be removed.  Wrap patch in a tissue and discard in the trash. Wash hands thoroughly with soap and water. 2. You may remove the patch earlier than 72 hours if you experience unpleasant side effects which may include dry mouth, dizziness or visual disturbances. 3. Avoid touching the patch. Wash your hands with soap and water after contact with the patch.    About my Jackson-Pratt Bulb Drain  What is a Jackson-Pratt bulb? A Jackson-Pratt is a soft, round device used to collect drainage. It is connected to a long, thin drainage catheter, which is held in place by one or two small stiches near your surgical incision site. When the bulb is squeezed, it forms a vacuum, forcing the drainage to empty into the bulb.  Emptying the Jackson-Pratt bulb- To empty the  bulb: 1. Release the plug on the top of the bulb. 2. Pour the bulb's contents into a measuring container which your nurse will provide. 3. Record the time emptied and amount of drainage. Empty the drain(s) as often as your     doctor or nurse recommends.  Date                  Time                    Amount (Drain 1)                 Amount (Drain 2)  _____________________________________________________________________  _____________________________________________________________________  _____________________________________________________________________  _____________________________________________________________________  _____________________________________________________________________  _____________________________________________________________________  _____________________________________________________________________  _____________________________________________________________________  Squeezing the Jackson-Pratt Bulb- To squeeze the bulb: 1. Make sure the plug at the top of the bulb is open. 2. Squeeze the bulb tightly in your fist. You will hear air squeezing from the bulb. 3. Replace the plug while the bulb is squeezed. 4. Use a safety pin to attach the bulb to your clothing. This will keep the catheter from     pulling at the bulb insertion site.  When to call your doctor- Call your doctor if:  Drain site becomes red, swollen or hot.  You have a fever greater than 101 degrees F.  There is oozing at the drain site.  Drain falls out (apply a guaze bandage over the drain hole and secure it with tape).  Drainage increases daily not related to activity patterns. (You will usually have more drainage when you are  active than when you are resting.)  Drainage has a bad odor.

## 2020-02-22 NOTE — Anesthesia Procedure Notes (Signed)
Procedure Name: Intubation Date/Time: 02/22/2020 1:19 PM Performed by: Gwyndolyn Saxon, CRNA Pre-anesthesia Checklist: Patient identified, Emergency Drugs available, Suction available and Patient being monitored Patient Re-evaluated:Patient Re-evaluated prior to induction Oxygen Delivery Method: Circle system utilized Preoxygenation: Pre-oxygenation with 100% oxygen Induction Type: IV induction Ventilation: Mask ventilation without difficulty Laryngoscope Size: Miller and 2 Grade View: Grade I Tube type: Oral Tube size: 7.0 mm Number of attempts: 1 Airway Equipment and Method: Stylet Placement Confirmation: ETT inserted through vocal cords under direct vision,  positive ETCO2 and breath sounds checked- equal and bilateral Secured at: 20 cm Tube secured with: Tape Dental Injury: Teeth and Oropharynx as per pre-operative assessment

## 2020-02-22 NOTE — Anesthesia Preprocedure Evaluation (Signed)
Anesthesia Evaluation  Patient identified by MRN, date of birth, ID band Patient awake    Reviewed: Allergy & Precautions, NPO status , Patient's Chart, lab work & pertinent test results  Airway Mallampati: I  TM Distance: >3 FB Neck ROM: Full    Dental   Pulmonary    Pulmonary exam normal        Cardiovascular hypertension, Pt. on medications Normal cardiovascular exam     Neuro/Psych    GI/Hepatic   Endo/Other    Renal/GU      Musculoskeletal   Abdominal   Peds  Hematology   Anesthesia Other Findings   Reproductive/Obstetrics                             Anesthesia Physical Anesthesia Plan  ASA: II  Anesthesia Plan: General   Post-op Pain Management:    Induction: Intravenous  PONV Risk Score and Plan: 3 and Ondansetron, Midazolam and Treatment may vary due to age or medical condition  Airway Management Planned: Oral ETT  Additional Equipment:   Intra-op Plan:   Post-operative Plan: Extubation in OR  Informed Consent: I have reviewed the patients History and Physical, chart, labs and discussed the procedure including the risks, benefits and alternatives for the proposed anesthesia with the patient or authorized representative who has indicated his/her understanding and acceptance.       Plan Discussed with: CRNA and Surgeon  Anesthesia Plan Comments:         Anesthesia Quick Evaluation

## 2020-02-22 NOTE — Anesthesia Postprocedure Evaluation (Signed)
Anesthesia Post Note  Patient: [name]  Procedure(s) Performed: REMOVAL OF BILATERAL TISSUE EXPANDERS AND PLACEMENT OF SILICONE IMPLANTS (Bilateral Breast)     Patient location during evaluation: PACU Anesthesia Type: General Level of consciousness: awake and alert Pain management: pain level controlled Vital Signs Assessment: post-procedure vital signs reviewed and stable Respiratory status: spontaneous breathing, nonlabored ventilation, respiratory function stable and patient connected to nasal cannula oxygen Cardiovascular status: blood pressure returned to baseline and stable Postop Assessment: no apparent nausea or vomiting Anesthetic complications: no   No complications documented.  Last Vitals:  Vitals:   02/22/20 1545 02/22/20 1600  BP: 130/72 115/65  Pulse: 81 82  Resp: 16 16  Temp:    SpO2: 96% 95%    Last Pain:  Vitals:   02/22/20 1600  TempSrc:   PainSc: 3                  Trestan Vahle DAVID

## 2020-02-22 NOTE — Op Note (Signed)
Operative Note   DATE OF OPERATION: 8.6.21  LOCATION: [facility]-outpatient  SURGICAL DIVISION: Plastic Surgery  PREOPERATIVE DIAGNOSES:  1. Acquired absence breasts 2. 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 Family history breast cancer 4. Cellulitis right chest  POSTOPERATIVE DIAGNOSES:  same  PROCEDURE:  1. Removal bilateral chest tissue expanders and placement silicone implants  SURGEON: Irene Limbo MD MBA  ASSISTANT: none  ANESTHESIA:  General.   EBL: 50 ml  COMPLICATIONS: None immediate.   INDICATIONS FOR PROCEDURE:  The patient, Christina Baldwin, is a 44 y.o. female born on 30-May-1976, is here for implant placement as part of breast reconstruction. She is post operative from bilateral skin reduction pattern mastectomies with immediate expander placement. Course complicated by right chest infection and eventual removal expander. She is presently 3.5 weeks post replacement right tissue expander with cellulitis right chest and S. Aureus growing from culture of fluid aspirated surrounding right chest. Plan wash out cavities and placement new prosthesis in attempt to salvage reconstruction. Patient understands if this does not clear infection may require removal prosthesis.   FINDINGS: Acellular dermis over portion anterior mastectomy flap not adherent and dissolving. Removed entirety of acellular dermis from right chest. Over left chest complete incorporation ADM noted. Natrelle Inspira Extra Projection Smooth Round implants placed bilateral. RIGHT 525 ml REF SRX-525 SN [id number] LEFT 580 ml REF SRX-580 SN [id number]  DESCRIPTION OF PROCEDURE:  The patient's operative site was marked with the patient in the preoperative area.The patientwas taken to the operating room. SCDs were placed and IV antibiotics were given. The patient's operative site was prepped and draped in a sterile fashion. A time out was performed and all information was confirmed to be correct.   Ioban placed  over bilateral chest. Incision made in prior rightinframammary foldmastectomy scar and carried through superficial fascia and acellular dermis. Upon entering cavity approximately 50 ml tan fluid drained. Expander removed. As noted in findings, area of acellular dermis not adherent to anterior mastectomy flap. Entirety ADM removed from right chest. Curettage performed over right chest. Cavity irrigated with saline.   At this time, Ioban removed. New instruments, gloves, and drapes utilized for remainder of case. Ioban placed surrounding incisions.  Incision made in left inframammary foldmastectomy scar and carried through superficial fascia and acellular dermis. Expander removed. Capsulotomy performed superiorly. Sizer placed in each chest. Patient brought to upright sitting position and assessed for symmetry. An Extra Projection 525 ml implant selected for right chest and an Extra Projection 580 ml implant selected for left chest. Patient returned to supine position.  Bilateral chest cavities irrigated with Ancef, gentamicin and bacitracin. Hemostasis obtained.Cavities irrigated with saline Betadine solution. Implant placed in left chest and orientation ensured. Skin closure completedwith 3-0 vicryl in fascial layer, 4-0 vicryl in dermis, and skin closure completed with 4-0 monocryl subcuticular.  Over right chest, a 19 Fr JP placed percutaneously and secured with 2-0 nylon. Implant placed in cavity and orientation ensured. Closure completed with 3-0 monocryl in superficial fascia, 4-0 monocryl in dermis, and short running 4-0 nylon for skin closure. Steri strips applied followed by dressing. Biopatch and Tegaderm applied to drain site. Breast binder applied.   The patient was allowed to wake from anesthesia, extubated and taken to the recovery room in satisfactory condition.   SPECIMENS: none  DRAINS: 19 Fr JP in right breast reconstruction

## 2020-02-22 NOTE — Interval H&P Note (Signed)
History and Physical Interval Note:  02/22/2020 1:07 PM  [place]  has presented today for surgery, with the diagnosis of acquired absence of breast and nipple, high risk for breast cancer.  The various methods of treatment have been discussed with the patient and family. After consideration of risks, benefits and other options for treatment, the patient has consented to  Procedure(s): REMOVAL OF BILATERAL TISSUE EXPANDERS AND PLACEMENT OF SILICONE IMPLANTS (Bilateral) as a surgical intervention.  The patient's history has been reviewed, patient examined, no change in status, stable for surgery.  I have reviewed the patient's chart and labs.  Questions were answered to the patient's satisfaction.     [name] Khushbu Pippen

## 2020-02-22 NOTE — Transfer of Care (Signed)
Immediate Anesthesia Transfer of Care Note  Patient: Christina Baldwin  Procedure(s) Performed: REMOVAL OF BILATERAL TISSUE EXPANDERS AND PLACEMENT OF SILICONE IMPLANTS (Bilateral Breast)  Patient Location: PACU  Anesthesia Type:General  Level of Consciousness: drowsy  Airway & Oxygen Therapy: Patient Spontanous Breathing and Patient connected to face mask oxygen  Post-op Assessment: Report given to RN and Post -op Vital signs reviewed and stable  Post vital signs: Reviewed and stable  Last Vitals:  Vitals Value Taken Time  BP 117/62 02/22/20 1527  Temp    Pulse 93 02/22/20 1530  Resp 16 02/22/20 1530  SpO2 99 % 02/22/20 1530  Vitals shown include unvalidated device data.  Last Pain:  Vitals:   02/22/20 1211  TempSrc: Oral  PainSc: 0-No pain      Patients Stated Pain Goal: 3 (90/30/14 9969)  Complications: No complications documented.

## 2020-02-25 ENCOUNTER — Encounter (HOSPITAL_BASED_OUTPATIENT_CLINIC_OR_DEPARTMENT_OTHER): Payer: Self-pay | Admitting: Plastic Surgery

## 2020-05-12 ENCOUNTER — Encounter (HOSPITAL_BASED_OUTPATIENT_CLINIC_OR_DEPARTMENT_OTHER): Payer: Self-pay | Admitting: Plastic Surgery

## 2020-05-12 ENCOUNTER — Other Ambulatory Visit: Payer: Self-pay

## 2020-05-12 NOTE — H&P (Signed)
  Subjective:     Patient ID: Christina Baldwin is a 44 y.o. female.  HPI  3 months post op implant exchange. History redness following drain removal following replacement TE, aspirated fluid from around TE with culture S. Aureus, completed Augmentin course. Notes right reconstruction not filling bra, notes right implant has fallen on chest. Satisfied with left reconstruction. Presents for revision right breast reconstruction.  Intraop findings 8.6.21 "Acellular dermis over portion anterior mastectomy flap not adherent and dissolving. Removed entirety of acellular dermis from right chest. Over left chest complete incorporation ADM noted."  First MMG 2020 which showed 2 areas of concern in the right breast upper quadrant. 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 Family history breast ca in maternal aunt and cousin. Met with Dr. Lindi Adie who counseled approximately 24% lifetime risk of breast cancer by the [facility] model. Given likely pathogenic genetic mutation as below, risk felt to be overall higher than this.   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  MRI 09/2019 normal  Underwent bilateral SRM with immediate expander ADM reconstruction.Final pathology benign breast tissue bilateral. Course complicated by onset fever 2 weeks postoperative with cellulitis right chest and ultimate removal right TE/ADM, culture few Proteus Mirabilis, completed Cipro course.   Prior D/DD. Right mastectomy 772 g Left 554 g  PMH significant for pre diabetes on metformin, last HbA1c 5.8 03/2020. History seizure d/o- last over 10 years ago, no current medication.  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 Has aunt in [place] that can assist with post op  care.  Review of Systems     Objective:   Physical Exam  Pulm: clear to auscultation CV: normal heart sounds Chest: bilateral chest soft scars maturing  rippling right chest, inferior and lateral malposition right implant, with implant position manually corrected on chest, volume near symmetric Notes left lower pole with more flatness medially  Abd: soft no hernias, striae present with redundant soft tissue Assessment:     High risk breast cancer Family history breast cancer S/p bilateral SRM, prepectoral TE/ADM (Alloderm) reconstruction S/p removal right TE S/p replacement right prepectoral TE/ADM (Alloderm) S/p silicone implant exchange removal right chest ADM    Plan:      Displacement right implant- reviewed this is likely to absence of ADM support. Discussed revision with additional human vs porcine dermal substrate. Plan placement human ADM. Reviewed use of drain on this chest. With regards to implant volume, reviewed her mastectomy flap was thicker over her native larger right breast- I anticipate the overall implant volume will still be less than the volume of left breast implant, however can try larger sizes. Reviewed risk stretching ADM recurrent displacement or infection or need for additional surgery continued asymmetry.  Plan fat grafting to bilateral chest. Reviewed purpose of this to thicken flaps limit visible ripplingand aid in contour. Reviewed donor site pain, need for compression, variable take graft, fat necrosis that presents as masses and may require work up. Counseled will not note aesthetic change to donor site.   Natrelle Inspira Extra Projection Smooth Round implants placed bilateral.  RIGHT 525 ml REF SRX-525 SN [id number] LEFT 580 ml REF SRX-580

## 2020-05-15 ENCOUNTER — Other Ambulatory Visit (HOSPITAL_COMMUNITY)
Admission: RE | Admit: 2020-05-15 | Discharge: 2020-05-15 | Disposition: A | Discharge status: Home / Self Care | Payer: Managed Care, Other (non HMO) | Source: Ambulatory Visit | Attending: Plastic Surgery | Admitting: Plastic Surgery

## 2020-05-15 DIAGNOSIS — Z01812 Encounter for preprocedural laboratory examination: Secondary | ICD-10-CM | POA: Diagnosis not present

## 2020-05-15 DIAGNOSIS — Z20822 Contact with and (suspected) exposure to covid-19: Secondary | ICD-10-CM | POA: Diagnosis not present

## 2020-05-15 LAB — SARS CORONAVIRUS 2 (TAT 6-24 HRS): SARS Coronavirus 2: NEGATIVE

## 2020-05-15 NOTE — Progress Notes (Signed)
Surgical soap given with instructions, pt verbalized understanding.  

## 2020-05-19 ENCOUNTER — Encounter (HOSPITAL_BASED_OUTPATIENT_CLINIC_OR_DEPARTMENT_OTHER)
Admission: RE | Disposition: A | Discharge status: Home / Self Care | Payer: Self-pay | Source: Home / Self Care | Attending: Plastic Surgery

## 2020-05-19 ENCOUNTER — Ambulatory Visit (HOSPITAL_BASED_OUTPATIENT_CLINIC_OR_DEPARTMENT_OTHER)
Admission: RE | Admit: 2020-05-19 | Discharge: 2020-05-19 | Disposition: A | Discharge status: Home / Self Care | Payer: Managed Care, Other (non HMO) | Attending: Plastic Surgery | Admitting: Plastic Surgery

## 2020-05-19 ENCOUNTER — Ambulatory Visit (HOSPITAL_BASED_OUTPATIENT_CLINIC_OR_DEPARTMENT_OTHER): Payer: Managed Care, Other (non HMO) | Admitting: Anesthesiology

## 2020-05-19 ENCOUNTER — Encounter (HOSPITAL_BASED_OUTPATIENT_CLINIC_OR_DEPARTMENT_OTHER): Payer: Self-pay | Admitting: Plastic Surgery

## 2020-05-19 ENCOUNTER — Other Ambulatory Visit: Payer: Self-pay

## 2020-05-19 DIAGNOSIS — I10 Essential (primary) hypertension: Secondary | ICD-10-CM | POA: Diagnosis not present

## 2020-05-19 DIAGNOSIS — Z9013 Acquired absence of bilateral breasts and nipples: Secondary | ICD-10-CM | POA: Insufficient documentation

## 2020-05-19 DIAGNOSIS — Z9189 Other specified personal risk factors, not elsewhere classified: Secondary | ICD-10-CM | POA: Diagnosis not present

## 2020-05-19 DIAGNOSIS — X58XXXA Exposure to other specified factors, initial encounter: Secondary | ICD-10-CM | POA: Diagnosis not present

## 2020-05-19 DIAGNOSIS — Z7984 Long term (current) use of oral hypoglycemic drugs: Secondary | ICD-10-CM | POA: Insufficient documentation

## 2020-05-19 DIAGNOSIS — R7303 Prediabetes: Secondary | ICD-10-CM | POA: Insufficient documentation

## 2020-05-19 DIAGNOSIS — L03313 Cellulitis of chest wall: Secondary | ICD-10-CM | POA: Insufficient documentation

## 2020-05-19 DIAGNOSIS — T8542XA Displacement of breast prosthesis and implant, initial encounter: Secondary | ICD-10-CM | POA: Insufficient documentation

## 2020-05-19 DIAGNOSIS — Z803 Family history of malignant neoplasm of breast: Secondary | ICD-10-CM | POA: Diagnosis not present

## 2020-05-19 HISTORY — PX: BREAST IMPLANT EXCHANGE: SHX6296

## 2020-05-19 HISTORY — PX: APPLICATION OF A-CELL OF CHEST/ABDOMEN: SHX6302

## 2020-05-19 HISTORY — PX: LIPOSUCTION WITH LIPOFILLING: SHX6436

## 2020-05-19 LAB — POCT PREGNANCY, URINE: Preg Test, Ur: NEGATIVE

## 2020-05-19 SURGERY — REPLACEMENT, IMPLANT, BREAST
Anesthesia: General | Site: Chest | Laterality: Right

## 2020-05-19 MED ORDER — MIDAZOLAM HCL 2 MG/2ML IJ SOLN
INTRAMUSCULAR | Status: AC
  Filled 2020-05-19: qty 2

## 2020-05-19 MED ORDER — SODIUM CHLORIDE 0.9 % IV SOLN
INTRAVENOUS | Status: DC | PRN
  Administered 2020-05-19: 500 mL

## 2020-05-19 MED ORDER — CEFAZOLIN SODIUM-DEXTROSE 2-4 GM/100ML-% IV SOLN
2.0000 g | INTRAVENOUS | Status: AC
  Administered 2020-05-19: 2 g via INTRAVENOUS

## 2020-05-19 MED ORDER — LIDOCAINE HCL (CARDIAC) PF 100 MG/5ML IV SOSY
PREFILLED_SYRINGE | INTRAVENOUS | Status: DC | PRN
  Administered 2020-05-19: 40 mg via INTRAVENOUS
  Administered 2020-05-19: 60 mg via INTRAVENOUS

## 2020-05-19 MED ORDER — ACETAMINOPHEN 500 MG PO TABS
1000.0000 mg | ORAL_TABLET | ORAL | Status: AC
  Administered 2020-05-19: 1000 mg via ORAL

## 2020-05-19 MED ORDER — SUGAMMADEX SODIUM 500 MG/5ML IV SOLN
INTRAVENOUS | Status: DC | PRN
  Administered 2020-05-19: 200 mg via INTRAVENOUS

## 2020-05-19 MED ORDER — CHLORHEXIDINE GLUCONATE CLOTH 2 % EX PADS: 6.0000 | MEDICATED_PAD | Freq: Once | CUTANEOUS | Status: DC

## 2020-05-19 MED ORDER — MIDAZOLAM HCL 5 MG/5ML IJ SOLN
INTRAMUSCULAR | Status: DC | PRN
  Administered 2020-05-19: 2 mg via INTRAVENOUS

## 2020-05-19 MED ORDER — DEXAMETHASONE SODIUM PHOSPHATE 4 MG/ML IJ SOLN
INTRAMUSCULAR | Status: DC | PRN
  Administered 2020-05-19: 10 mg via INTRAVENOUS

## 2020-05-19 MED ORDER — OXYCODONE HCL 5 MG PO TABS
ORAL_TABLET | ORAL | Status: AC
  Filled 2020-05-19: qty 1

## 2020-05-19 MED ORDER — AMOXICILLIN-POT CLAVULANATE 875-125 MG PO TABS: 1.0000 | ORAL_TABLET | Freq: Two times a day (BID) | ORAL | 0 refills | Status: AC

## 2020-05-19 MED ORDER — SCOPOLAMINE 1 MG/3DAYS TD PT72
MEDICATED_PATCH | TRANSDERMAL | Status: DC | PRN
  Administered 2020-05-19: 1 via TRANSDERMAL

## 2020-05-19 MED ORDER — ACETAMINOPHEN 500 MG PO TABS
ORAL_TABLET | ORAL | Status: AC
  Filled 2020-05-19: qty 2

## 2020-05-19 MED ORDER — GABAPENTIN 300 MG PO CAPS
300.0000 mg | ORAL_CAPSULE | ORAL | Status: AC
  Administered 2020-05-19: 300 mg via ORAL

## 2020-05-19 MED ORDER — FENTANYL CITRATE (PF) 100 MCG/2ML IJ SOLN
INTRAMUSCULAR | Status: AC
  Filled 2020-05-19: qty 2

## 2020-05-19 MED ORDER — GABAPENTIN 300 MG PO CAPS
ORAL_CAPSULE | ORAL | Status: AC
  Filled 2020-05-19: qty 1

## 2020-05-19 MED ORDER — FENTANYL CITRATE (PF) 100 MCG/2ML IJ SOLN
INTRAMUSCULAR | Status: DC | PRN
  Administered 2020-05-19: 50 ug via INTRAVENOUS
  Administered 2020-05-19: 100 ug via INTRAVENOUS
  Administered 2020-05-19: 50 ug via INTRAVENOUS

## 2020-05-19 MED ORDER — AMISULPRIDE (ANTIEMETIC) 5 MG/2ML IV SOLN: 10.0000 mg | Freq: Once | INTRAVENOUS | Status: DC | PRN

## 2020-05-19 MED ORDER — LIDOCAINE 2% (20 MG/ML) 5 ML SYRINGE
INTRAMUSCULAR | Status: AC
  Filled 2020-05-19: qty 5

## 2020-05-19 MED ORDER — ROCURONIUM BROMIDE 10 MG/ML (PF) SYRINGE
PREFILLED_SYRINGE | INTRAVENOUS | Status: AC
  Filled 2020-05-19: qty 10

## 2020-05-19 MED ORDER — LIDOCAINE HCL (PF) 1 % IJ SOLN
INTRAMUSCULAR | Status: AC
  Filled 2020-05-19: qty 60

## 2020-05-19 MED ORDER — PROPOFOL 10 MG/ML IV BOLUS
INTRAVENOUS | Status: AC
  Filled 2020-05-19: qty 20

## 2020-05-19 MED ORDER — ONDANSETRON HCL 4 MG/2ML IJ SOLN
INTRAMUSCULAR | Status: AC
  Filled 2020-05-19: qty 2

## 2020-05-19 MED ORDER — BUPIVACAINE HCL (PF) 0.25 % IJ SOLN
INTRAMUSCULAR | Status: AC
  Filled 2020-05-19: qty 30

## 2020-05-19 MED ORDER — ROCURONIUM BROMIDE 100 MG/10ML IV SOLN
INTRAVENOUS | Status: DC | PRN
  Administered 2020-05-19 (×2): 10 mg via INTRAVENOUS
  Administered 2020-05-19: 50 mg via INTRAVENOUS
  Administered 2020-05-19: 20 mg via INTRAVENOUS

## 2020-05-19 MED ORDER — CEFAZOLIN SODIUM-DEXTROSE 2-4 GM/100ML-% IV SOLN
INTRAVENOUS | Status: AC
  Filled 2020-05-19: qty 100

## 2020-05-19 MED ORDER — SODIUM BICARBONATE 4.2 % IV SOLN
INTRAVENOUS | Status: AC
  Filled 2020-05-19: qty 10

## 2020-05-19 MED ORDER — PROPOFOL 500 MG/50ML IV EMUL
INTRAVENOUS | Status: DC | PRN
  Administered 2020-05-19: 25 ug/kg/min via INTRAVENOUS

## 2020-05-19 MED ORDER — METHOCARBAMOL 500 MG PO TABS: 500.0000 mg | ORAL_TABLET | Freq: Three times a day (TID) | ORAL | 0 refills | Status: DC | PRN

## 2020-05-19 MED ORDER — LACTATED RINGERS IV SOLN: INTRAVENOUS | Status: DC

## 2020-05-19 MED ORDER — CELECOXIB 200 MG PO CAPS
200.0000 mg | ORAL_CAPSULE | ORAL | Status: AC
  Administered 2020-05-19: 200 mg via ORAL

## 2020-05-19 MED ORDER — SCOPOLAMINE 1 MG/3DAYS TD PT72
MEDICATED_PATCH | TRANSDERMAL | Status: AC
  Filled 2020-05-19: qty 1

## 2020-05-19 MED ORDER — OXYCODONE HCL 5 MG PO TABS
5.0000 mg | ORAL_TABLET | Freq: Once | ORAL | Status: AC
  Administered 2020-05-19: 5 mg via ORAL

## 2020-05-19 MED ORDER — ONDANSETRON HCL 4 MG/2ML IJ SOLN
INTRAMUSCULAR | Status: DC | PRN
  Administered 2020-05-19: 4 mg via INTRAVENOUS

## 2020-05-19 MED ORDER — PROPOFOL 500 MG/50ML IV EMUL
INTRAVENOUS | Status: AC
  Filled 2020-05-19: qty 50

## 2020-05-19 MED ORDER — FENTANYL CITRATE (PF) 100 MCG/2ML IJ SOLN
25.0000 ug | INTRAMUSCULAR | Status: DC | PRN
  Administered 2020-05-19 (×2): 25 ug via INTRAVENOUS

## 2020-05-19 MED ORDER — CELECOXIB 200 MG PO CAPS
ORAL_CAPSULE | ORAL | Status: AC
  Filled 2020-05-19: qty 1

## 2020-05-19 MED ORDER — OXYCODONE HCL 5 MG PO TABS: 5.0000 mg | ORAL_TABLET | ORAL | 0 refills | Status: DC | PRN

## 2020-05-19 MED ORDER — DEXAMETHASONE SODIUM PHOSPHATE 10 MG/ML IJ SOLN
INTRAMUSCULAR | Status: AC
  Filled 2020-05-19: qty 1

## 2020-05-19 MED ORDER — EPINEPHRINE PF 1 MG/ML IJ SOLN
INTRAMUSCULAR | Status: AC
  Filled 2020-05-19: qty 1

## 2020-05-19 MED ORDER — PROPOFOL 10 MG/ML IV BOLUS
INTRAVENOUS | Status: DC | PRN
  Administered 2020-05-19: 170 mg via INTRAVENOUS

## 2020-05-19 MED ORDER — SODIUM BICARBONATE 4 % IV SOLN
INTRAVENOUS | Status: DC | PRN
  Administered 2020-05-19: 400 mL via INTRAMUSCULAR

## 2020-05-19 SURGICAL SUPPLY — 100 items
ADH SKN CLS APL DERMABOND .7 (GAUZE/BANDAGES/DRESSINGS) ×6
ALLODERM 8X16 MED THICK (Tissue) ×4 IMPLANT
APL PRP STRL LF DISP 70% ISPRP (MISCELLANEOUS) ×6
BAG DECANTER FOR FLEXI CONT (MISCELLANEOUS) ×4 IMPLANT
BINDER ABDOMINAL 10 UNV 27-48 (MISCELLANEOUS) IMPLANT
BINDER ABDOMINAL 12 SM 30-45 (SOFTGOODS) ×4 IMPLANT
BINDER BREAST LRG (GAUZE/BANDAGES/DRESSINGS) IMPLANT
BINDER BREAST MEDIUM (GAUZE/BANDAGES/DRESSINGS) IMPLANT
BINDER BREAST XLRG (GAUZE/BANDAGES/DRESSINGS) IMPLANT
BINDER BREAST XXLRG (GAUZE/BANDAGES/DRESSINGS) ×4 IMPLANT
BIOPATCH RED 1 DISK 7.0 (GAUZE/BANDAGES/DRESSINGS) ×4 IMPLANT
BLADE SURG 10 STRL SS (BLADE) ×4 IMPLANT
BLADE SURG 11 STRL SS (BLADE) ×4 IMPLANT
BNDG GAUZE ELAST 4 BULKY (GAUZE/BANDAGES/DRESSINGS) ×8 IMPLANT
CANISTER LIPO FAT HARVEST (MISCELLANEOUS) ×4 IMPLANT
CANISTER SUCT 1200ML W/VALVE (MISCELLANEOUS) ×4 IMPLANT
CHLORAPREP W/TINT 26 (MISCELLANEOUS) ×8 IMPLANT
COVER BACK TABLE 60X90IN (DRAPES) ×4 IMPLANT
COVER MAYO STAND STRL (DRAPES) ×8 IMPLANT
COVER WAND RF STERILE (DRAPES) IMPLANT
DECANTER SPIKE VIAL GLASS SM (MISCELLANEOUS) IMPLANT
DERMABOND ADVANCED (GAUZE/BANDAGES/DRESSINGS) ×2
DERMABOND ADVANCED .7 DNX12 (GAUZE/BANDAGES/DRESSINGS) ×6 IMPLANT
DRAIN CHANNEL 15F RND FF W/TCR (WOUND CARE) IMPLANT
DRAIN CHANNEL 19F RND (DRAIN) ×4 IMPLANT
DRAPE INCISE IOBAN 66X45 STRL (DRAPES) IMPLANT
DRAPE TOP ARMCOVERS (MISCELLANEOUS) ×4 IMPLANT
DRAPE U-SHAPE 76X120 STRL (DRAPES) ×4 IMPLANT
DRAPE UTILITY XL STRL (DRAPES) ×8 IMPLANT
DRSG PAD ABDOMINAL 8X10 ST (GAUZE/BANDAGES/DRESSINGS) ×16 IMPLANT
DRSG TEGADERM 2-3/8X2-3/4 SM (GAUZE/BANDAGES/DRESSINGS) ×4 IMPLANT
ELECT BLADE 4.0 EZ CLEAN MEGAD (MISCELLANEOUS)
ELECT COATED BLADE 2.86 ST (ELECTRODE) IMPLANT
ELECT REM PT RETURN 9FT ADLT (ELECTROSURGICAL) ×4
ELECTRODE BLDE 4.0 EZ CLN MEGD (MISCELLANEOUS) IMPLANT
ELECTRODE REM PT RTRN 9FT ADLT (ELECTROSURGICAL) ×3 IMPLANT
EVACUATOR SILICONE 100CC (DRAIN) ×4 IMPLANT
EXTRACTOR CANIST REVOLVE STRL (CANNISTER) IMPLANT
GLOVE BIO SURGEON STRL SZ 6 (GLOVE) ×12 IMPLANT
GLOVE BIO SURGEON STRL SZ 6.5 (GLOVE) IMPLANT
GLOVE BIO SURGEON STRL SZ7 (GLOVE) ×8 IMPLANT
GLOVE BIOGEL PI IND STRL 6.5 (GLOVE) IMPLANT
GLOVE BIOGEL PI INDICATOR 6.5 (GLOVE)
GLOVE ECLIPSE 6.5 STRL STRAW (GLOVE) ×4 IMPLANT
GOWN STRL REUS W/ TWL LRG LVL3 (GOWN DISPOSABLE) ×9 IMPLANT
GOWN STRL REUS W/TWL LRG LVL3 (GOWN DISPOSABLE) ×12
IMPL BREAST P6.4XRND XFULL 580 (Breast) ×3 IMPLANT
IMPL BRST P6.4XRND XFULL 580CC (Breast) ×3 IMPLANT
IMPLANT BREAST GEL 580CC (Breast) ×4 IMPLANT
IV NS 500ML (IV SOLUTION)
IV NS 500ML BAXH (IV SOLUTION) IMPLANT
KIT FILL SYSTEM UNIVERSAL (SET/KITS/TRAYS/PACK) IMPLANT
LINER CANISTER 1000CC FLEX (MISCELLANEOUS) ×4 IMPLANT
MARKER SKIN DUAL TIP RULER LAB (MISCELLANEOUS) IMPLANT
NDL SAFETY ECLIPSE 18X1.5 (NEEDLE) ×9 IMPLANT
NEEDLE FILTER BLUNT 18X 1/2SAF (NEEDLE)
NEEDLE FILTER BLUNT 18X1 1/2 (NEEDLE) IMPLANT
NEEDLE HYPO 18GX1.5 SHARP (NEEDLE) ×12
NEEDLE HYPO 25X1 1.5 SAFETY (NEEDLE) IMPLANT
NS IRRIG 1000ML POUR BTL (IV SOLUTION) ×4 IMPLANT
PACK BASIN DAY SURGERY FS (CUSTOM PROCEDURE TRAY) ×4 IMPLANT
PAD ALCOHOL SWAB (MISCELLANEOUS) ×4 IMPLANT
PENCIL SMOKE EVACUATOR (MISCELLANEOUS) ×4 IMPLANT
PIN SAFETY STERILE (MISCELLANEOUS) ×4 IMPLANT
PUNCH BIOPSY DERMAL 4MM (MISCELLANEOUS) ×4 IMPLANT
SHEET MEDIUM DRAPE 40X70 STRL (DRAPES) ×8 IMPLANT
SIZER BREAST REUSE GEL 560CC (SIZER) ×4
SIZER BREAST REUSE GEL 580CC (SIZER) ×4
SIZER BRST REUSE GEL 560CC (SIZER) ×3 IMPLANT
SIZER BRST REUSE GEL 580CC (SIZER) ×3 IMPLANT
SLEEVE SCD COMPRESS KNEE MED (MISCELLANEOUS) ×4 IMPLANT
SPONGE LAP 18X18 RF (DISPOSABLE) ×8 IMPLANT
STAPLER VISISTAT 35W (STAPLE) ×4 IMPLANT
STRIP CLOSURE SKIN 1/2X4 (GAUZE/BANDAGES/DRESSINGS) ×4 IMPLANT
SUT ETHILON 2 0 FS 18 (SUTURE) ×4 IMPLANT
SUT MNCRL AB 4-0 PS2 18 (SUTURE) ×8 IMPLANT
SUT PDS 3-0 CT2 (SUTURE)
SUT PDS AB 2-0 CT2 27 (SUTURE) ×16 IMPLANT
SUT PDS II 3-0 CT2 27 ABS (SUTURE) IMPLANT
SUT VIC AB 3-0 PS1 18 (SUTURE)
SUT VIC AB 3-0 PS1 18XBRD (SUTURE) IMPLANT
SUT VIC AB 3-0 SH 27 (SUTURE) ×4
SUT VIC AB 3-0 SH 27X BRD (SUTURE) ×3 IMPLANT
SUT VICRYL 4-0 PS2 18IN ABS (SUTURE) ×4 IMPLANT
SYR 10ML LL (SYRINGE) ×12 IMPLANT
SYR 20ML LL LF (SYRINGE) IMPLANT
SYR 50ML LL SCALE MARK (SYRINGE) ×8 IMPLANT
SYR BULB IRRIG 60ML STRL (SYRINGE) IMPLANT
SYR CONTROL 10ML LL (SYRINGE) IMPLANT
SYR EAR/ULCER 2OZ (SYRINGE) ×8 IMPLANT
SYR TB 1ML LL NO SAFETY (SYRINGE) ×4 IMPLANT
SYR TOOMEY IRRIG 70ML (MISCELLANEOUS)
SYRINGE TOOMEY IRRIG 70ML (MISCELLANEOUS) IMPLANT
TISSUE ALLDRM 8X16 MED THICK (Tissue) ×3 IMPLANT
TOWEL GREEN STERILE FF (TOWEL DISPOSABLE) ×8 IMPLANT
TUBE CONNECTING 20X1/4 (TUBING) ×4 IMPLANT
TUBING INFILTRATION IT-10001 (TUBING) ×4 IMPLANT
TUBING SET GRADUATE ASPIR 12FT (MISCELLANEOUS) ×4 IMPLANT
UNDERPAD 30X36 HEAVY ABSORB (UNDERPADS AND DIAPERS) ×8 IMPLANT
YANKAUER SUCT BULB TIP NO VENT (SUCTIONS) ×4 IMPLANT

## 2020-05-19 NOTE — Anesthesia Postprocedure Evaluation (Signed)
Anesthesia Post Note  Patient: [name]  Procedure(s) Performed: REVISION RIGHT BREAST RECONSTRUCTION WITH SILICONE IMPLANT EXCHANGE (Right Breast) ACELLULAR DERMIS TO RIGHT CHEST (Right Chest) LIPOFILLING FROM ABDOMEN TO BILATERAL CHEST (Bilateral Chest)     Patient location during evaluation: PACU Anesthesia Type: General Level of consciousness: awake and alert Pain management: pain level controlled Vital Signs Assessment: post-procedure vital signs reviewed and stable Respiratory status: spontaneous breathing, nonlabored ventilation and respiratory function stable Cardiovascular status: blood pressure returned to baseline and stable Postop Assessment: no apparent nausea or vomiting Anesthetic complications: no   No complications documented.  Last Vitals:  Vitals:   05/19/20 1530 05/19/20 1549  BP: (!) 137/91 (!) 147/96  Pulse: 85 90  Resp: 15 17  Temp:  36.7 C  SpO2: 96% 97%    Last Pain:  Vitals:   05/19/20 1602  TempSrc:   PainSc: 4                  Lidia Collum

## 2020-05-19 NOTE — Op Note (Signed)
Operative Note   DATE OF OPERATION: 11.1.21  LOCATION: [facility]-outpatient  SURGICAL DIVISION: Plastic Surgery  PREOPERATIVE DIAGNOSES:  1. Acquired absence breasts 2. High risk breast cancer  POSTOPERATIVE DIAGNOSES:  same  PROCEDURE:  1. Revision right breast reconstruction with silicone implant exchange 2. Acellular dermis to right chest 100 cm2 3. Lipofilling from abdomen to bilateral chest total 140 ml  SURGEON: Irene Limbo MD MBA  ASSISTANT: none  ANESTHESIA:  General.   EBL: 25 ml  COMPLICATIONS: None immediate.   INDICATIONS FOR PROCEDURE:  The patient, Christina Baldwin, is a 44 y.o. female born on Oct 19, 1975, is here for revision breast reconstruction. She has undergone bilateral skin reduction mastectomies with immediate implant based reconstruction. Course complicated by right chest cellulitis and seroma. At time of implant placement, right chest acellular dermis removed as portion anterior mastectomy flap not adherent and dissolving. She now presents with inferior and lateral right implant displacement and plan acellular dermis reinforcement.   FINDINGS: Removed intact smooth round silicone implant. Placed Natrelle Inspira Smooth Round Extra Projection 580 ml implant. REF SRX-580 SN [id number]. 70 ml fat infiltrated over right chest, 70 ml fat infiltrated over left chest.  DESCRIPTION OF PROCEDURE:  The patient's operative site was marked with the patient in the preoperative area including supra and infraumbilical abdomen for liposuction. The patient was taken to the operating room. SCDs were placed and IV antibiotics were given. The patient's operative site was prepped and draped in a sterile fashion. A time out was performed and all information was confirmed to be correct. Stab incision made over bilateral abdomen and tumescent infiltrated over supra and infraumbilical abdomen total 502 ml.   Incision made in right inframammary fold scar and carried through  superficial fascia and capsule. Implant removed. Interrupted 2-0 PDS suture placed from anterior mastectomy flap to chest wall to set desired inframammary fold symmetric with left. Sizer placed. Patient brought to upright sitting position. An extra projection 580 ml implant selected. Patient returned to supine position.  Power assisted liposuction performed over abdomen, total lipoaspirate 300 ml. Stab incision made at upper outer and lower outer left chest. Fat prepared by washing and gravity and infiltrated throughout bilateral mastectomy flaps. Liposuction ports, left chest incisions approximated with interrupted 4-0 monocryl simple stitch.  Acellular dermis soaked and perforated. This was inset to chest wall medial to desired anterior axillary line with 2-0 PDS suture. Lateral border acellular dermis inset to anterior mastectomy flap with interrupted 2-0 PDS suture. Cavity irrigated with saline solution containing Ancef and gentamicin followed by saline Betadine solution. 19 Fr JP drain placed percutaneously and secured with 2-0 nylon. Implant placed within cavity. Orientation implant ensured. Caudal border acellular dermis inset to anterior mastectomy flap and chest wall with interrupted 2-0 PDS suture. Incision closure completed with 3-0 vicryl in superficial fascia, 4-0 vicryl in dermis, and 4-0 monocryl subcuticular skin closure. Steris applied to inframammary fold scar.  Dry dressing, breast and abdominal binders applied.   The patient was allowed to wake from anesthesia, extubated and taken to the recovery room in satisfactory condition.   SPECIMENS: none  DRAINS: 19 Fr JP in right breast reconstruction

## 2020-05-19 NOTE — Interval H&P Note (Signed)
History and Physical Interval Note:  05/19/2020 11:04 AM  [place]  has presented today for surgery, with the diagnosis of acquired absence breasts at high risk breast cancer.  The various methods of treatment have been discussed with the patient and family. After consideration of risks, benefits and other options for treatment, the patient has consented to  Procedure(s): REVISION RIGHT BREAST RECONSTRUCTION WITH SILICONE IMPLANT EXCHANGE (Right) ACELLULAR DERMIS TO RIGHT CHEST (Right) LIPOFILLING FROM ABDOMEN TO BILATERAL CHEST (Bilateral) as a surgical intervention.  The patient's history has been reviewed, patient examined, no change in status, stable for surgery.  I have reviewed the patient's chart and labs.  Questions were answered to the patient's satisfaction.     [name] Gawain Crombie

## 2020-05-19 NOTE — Anesthesia Preprocedure Evaluation (Addendum)
Anesthesia Evaluation  Patient identified by MRN, date of birth, ID band Patient awake    Reviewed: Allergy & Precautions, NPO status , Patient's Chart, lab work & pertinent test results  Airway Mallampati: I  TM Distance: >3 FB Neck ROM: Full    Dental   Pulmonary neg pulmonary ROS,    Pulmonary exam normal        Cardiovascular hypertension, Pt. on medications Normal cardiovascular exam     Neuro/Psych negative neurological ROS     GI/Hepatic negative GI ROS, Neg liver ROS,   Endo/Other  negative endocrine ROS  Renal/GU negative Renal ROS     Musculoskeletal   Abdominal   Peds  Hematology negative hematology ROS (+)   Anesthesia Other Findings   Reproductive/Obstetrics                             Anesthesia Physical  Anesthesia Plan  ASA: II  Anesthesia Plan: General   Post-op Pain Management:    Induction: Intravenous  PONV Risk Score and Plan: 3 and Ondansetron, Midazolam and Treatment may vary due to age or medical condition  Airway Management Planned: Oral ETT  Additional Equipment:   Intra-op Plan:   Post-operative Plan: Extubation in OR  Informed Consent: I have reviewed the patients History and Physical, chart, labs and discussed the procedure including the risks, benefits and alternatives for the proposed anesthesia with the patient or authorized representative who has indicated his/her understanding and acceptance.       Plan Discussed with: CRNA and Surgeon  Anesthesia Plan Comments:         Anesthesia Quick Evaluation

## 2020-05-19 NOTE — Discharge Instructions (Signed)
     JP Drain [employer] this sheet to all of your post-operative appointments while you have your drains.  Please measure your drains by CC's or ML's.  Make sure you drain and measure your JP Drains 2 or 3 times per day.  At the end of each day, add up totals for the left side and add up totals for the right side.    ( 9 am )     ( 3 pm )        ( 9 pm )                Date L  R  L  R  L  R  Total L/R                                                                                                                                                                                        Post Anesthesia Home Care Instructions  Activity: Get plenty of rest for the remainder of the day. A responsible individual must stay with you for 24 hours following the procedure.  For the next 24 hours, DO NOT: -Drive a car -Paediatric nurse -Drink alcoholic beverages -Take any medication unless instructed by your physician -Make any legal decisions or sign important papers.  Meals: Start with liquid foods such as gelatin or soup. Progress to regular foods as tolerated. Avoid greasy, spicy, heavy foods. If nausea and/or vomiting occur, drink only clear liquids until the nausea and/or vomiting subsides. Call your physician if vomiting continues.  Special Instructions/Symptoms: Your throat may feel dry or sore from the anesthesia or the breathing tube placed in your throat during surgery. If this causes discomfort, gargle with warm salt water. The discomfort should disappear within 24 hours.  If you had a scopolamine patch placed behind your ear for the management of post- operative nausea and/or vomiting:  1. The medication in the patch is effective for 72 hours, after which it should be removed.  Wrap patch in a tissue and discard in the trash. Wash hands thoroughly with soap and water. 2. You may remove the patch earlier than 72 hours if you experience unpleasant side effects which  may include dry mouth, dizziness or visual disturbances. 3. Avoid touching the patch. Wash your hands with soap and water after contact with the patch.    May take Tylenol and Ibuprofen after 5:30pm, if needed.

## 2020-05-19 NOTE — Transfer of Care (Signed)
Immediate Anesthesia Transfer of Care Note  Patient: Christina Baldwin  Procedure(s) Performed: REVISION RIGHT BREAST RECONSTRUCTION WITH SILICONE IMPLANT EXCHANGE (Right Breast) ACELLULAR DERMIS TO RIGHT CHEST (Right Chest) LIPOFILLING FROM ABDOMEN TO BILATERAL CHEST (Bilateral Chest)  Patient Location: PACU  Anesthesia Type:General  Level of Consciousness: drowsy, patient cooperative and responds to stimulation  Airway & Oxygen Therapy: Patient Spontanous Breathing and Patient connected to face mask oxygen  Post-op Assessment: Report given to RN and Post -op Vital signs reviewed and stable  Post vital signs: Reviewed and stable  Last Vitals:  Vitals Value Taken Time  BP    Temp    Pulse 106 05/19/20 1506  Resp 15 05/19/20 1506  SpO2 100 % 05/19/20 1506  Vitals shown include unvalidated device data.  Last Pain:  Vitals:   05/19/20 1114  TempSrc: Oral  PainSc: 0-No pain      Patients Stated Pain Goal: 3 (33/00/76 2263)  Complications: No complications documented.

## 2020-05-19 NOTE — Anesthesia Procedure Notes (Signed)
Procedure Name: Intubation Date/Time: 05/19/2020 12:05 PM Performed by: Glory Buff, CRNA Pre-anesthesia Checklist: Patient identified, Emergency Drugs available, Suction available and Patient being monitored Patient Re-evaluated:Patient Re-evaluated prior to induction Oxygen Delivery Method: Circle system utilized Preoxygenation: Pre-oxygenation with 100% oxygen Induction Type: IV induction Ventilation: Mask ventilation without difficulty Laryngoscope Size: Miller and 3 Grade View: Grade I Tube type: Oral Tube size: 7.0 mm Number of attempts: 1 Airway Equipment and Method: Stylet and Oral airway Placement Confirmation: ETT inserted through vocal cords under direct vision,  positive ETCO2 and breath sounds checked- equal and bilateral Secured at: 21 cm Tube secured with: Tape Dental Injury: Teeth and Oropharynx as per pre-operative assessment

## 2020-05-20 ENCOUNTER — Encounter (HOSPITAL_BASED_OUTPATIENT_CLINIC_OR_DEPARTMENT_OTHER): Payer: Self-pay | Admitting: Plastic Surgery

## 2020-07-09 ENCOUNTER — Encounter: Payer: Self-pay | Admitting: Hematology and Oncology

## 2021-04-07 ENCOUNTER — Encounter: Payer: Self-pay | Admitting: Neurology

## 2021-04-07 ENCOUNTER — Other Ambulatory Visit: Payer: Self-pay

## 2021-04-07 ENCOUNTER — Telehealth: Payer: Self-pay | Admitting: Neurology

## 2021-04-07 ENCOUNTER — Ambulatory Visit: Payer: Managed Care, Other (non HMO) | Admitting: Neurology

## 2021-04-07 VITALS — BP 143/97 | HR 86 | Ht 64.0 in | Wt 220.0 lb

## 2021-04-07 DIAGNOSIS — G40909 Epilepsy, unspecified, not intractable, without status epilepticus: Secondary | ICD-10-CM

## 2021-04-07 DIAGNOSIS — R0683 Snoring: Secondary | ICD-10-CM

## 2021-04-07 DIAGNOSIS — Z853 Personal history of malignant neoplasm of breast: Secondary | ICD-10-CM

## 2021-04-07 DIAGNOSIS — R413 Other amnesia: Secondary | ICD-10-CM

## 2021-04-07 NOTE — Telephone Encounter (Signed)
EEG order sent to [facility] for scheduling.

## 2021-04-07 NOTE — Progress Notes (Signed)
[facility]  PATIENT: Christina Baldwin DOB: 11-Aug-1975  REFERRING DOCTOR OR PCP: Tereasa Coop, PA-C SOURCE: Patient, notes from primary care  _________________________________   HISTORICAL  CHIEF COMPLAINT:  Chief Complaint  Patient presents with   New Patient (Initial Visit)    Pt alone, rm 2. Pt has noticed memory concerns about a year ago that have been bothersome. She has noticed problems with word finding or getting words out.  They were unsure if this memory concerns was r/t to the multple surgeries last year that required sedation or if was r/t to the history of covid. Her last surgery was nov 2021 and they wanted to make sure 6-12 mths had past from last surgery to see if things got better. Pt states she feels has only worsened    HISTORY OF PRESENT ILLNESS:  I had the pleasure of seeing patient, Christina Baldwin, at [facility] for neurologic consultation regarding her cognitive symptoms and history of seizures  She is a 45 yo woman who began to note her thinking was more foggy about a year ago.  Specifically, she noted some short term memory issues.  She sometimes repeated herself.   In the last 6-9 months, she notes she repeats words or switches letters while typing.   She sometimes makes mistakes on similar words (substituting scents for sense as example).  She makes other language errors.    She feels information processing is reduced.   .  She denies difficulty with gait, balance, coordination, strength or sensation.   Vision is a little worse but felt to be due to correction.      She works in [employer] and notes some decreased processing but is still doing her job.  She has a higher worload due to short-staffing.     She has some insomnia and takes trazodone prn.   She takes prn Robaxin, sumatriptan for migraine headaches.    Headaches are doing the same this year as they have been x years.   She snores.   She had a PSG many years  ago (25 or more) ad it was fine.   She denies EDS.    She denies depression.     She has a h/o seizures.   She had a GTC seizure at age 54 and 73 or 4 more over the next few years.   She also had some petit mal seizures.   She had several EEG studies (last one 2000-2001) that were abnormal.   She was on phenobarbital and dilantin.  She had increases petit mal seizures with Dilantin and Tegretol She still wonders if she has petit mal seizures.       She Covid x 2, last time 11/2020.    She also had several breast pre-cancer related operations last year.   Initially, memory issues were felt to e related to these issues.     Montreal Cognitive Assessment  04/07/2021  Visuospatial/ Executive (0/5) 5  Naming (0/3) 3  Attention: Read list of digits (0/2) 2  Attention: Read list of letters (0/1) 1  Attention: Serial 7 subtraction starting at 100 (0/3) 3  Language: Repeat phrase (0/2) 0  Language : Fluency (0/1) 1  Abstraction (0/2) 2  Delayed Recall (0/5) 4  Orientation (0/6) 6  Total 27   TSH ws normal 10/02/2019.   B12 was normal 12/2020.    She is on metformin for NIDDM Type 2 (last HgbA1c was 5.6 03/24/2021 but was 6.5)  REVIEW  OF SYSTEMS: Constitutional: No fevers, chills, sweats, or change in appetite Eyes: No visual changes, double vision, eye pain Ear, nose and throat: No hearing loss, ear pain, nasal congestion, sore throat Cardiovascular: No chest pain, palpitations Respiratory:  No shortness of breath at rest or with exertion.   No wheezes GastrointestinaI: No nausea, vomiting, diarrhea, abdominal pain, fecal incontinence Genitourinary:  No dysuria, urinary retention or frequency.  No nocturia. Musculoskeletal:  No neck pain, back pain Integumentary: No rash, pruritus, skin lesions Neurological: as above Psychiatric: No depression at this time.  No anxiety Endocrine: No palpitations, diaphoresis, change in appetite, change in weigh or increased thirst Hematologic/Lymphatic:  No  anemia, purpura, petechiae. Allergic/Immunologic: No itchy/runny eyes, nasal congestion, recent allergic reactions, rashes  ALLERGIES: Allergies  Allergen Reactions   Sulfa Antibiotics     Bactrim  Renal failure    Liquid Bandage [New Skin] Rash    dermabond allergy     HOME MEDICATIONS:  Current Outpatient Medications:    cloNIDine (CATAPRES) 0.1 MG tablet, Take 0.1-0.2 mg by mouth daily., Disp: , Rfl:    metFORMIN (GLUCOPHAGE) 500 MG tablet, Take 500 mg by mouth daily., Disp: , Rfl:    methocarbamol (ROBAXIN) 750 MG tablet, Take 750 mg by mouth daily as needed., Disp: , Rfl:    rosuvastatin (CRESTOR) 20 MG tablet, Take 20 mg by mouth daily., Disp: , Rfl:    SUMAtriptan (IMITREX) 50 MG tablet, Take 50 mg by mouth every 2 (two) hours as needed for migraine. May repeat in 2 hours if headache persists or recurs., Disp: , Rfl:    traZODone (DESYREL) 50 MG tablet, Take 50 mg by mouth at bedtime., Disp: , Rfl:   PAST MEDICAL HISTORY: Past Medical History:  Diagnosis Date   ADD (attention deficit disorder)    Cancer ([place])    Family history of breast cancer    Family history of cervical cancer    Family history of lung cancer    Hypertension    Mixed hyperlipidemia    Pre-diabetes     PAST SURGICAL HISTORY: Past Surgical History:  Procedure Laterality Date   APPLICATION OF A-CELL OF CHEST/ABDOMEN Right 05/19/2020   Procedure: ACELLULAR DERMIS TO RIGHT CHEST;  Surgeon: Irene Limbo, MD;  Location: [place];  Service: Plastics;  Laterality: Right;   BREAST IMPLANT EXCHANGE Right 05/19/2020   Procedure: REVISION RIGHT BREAST RECONSTRUCTION WITH SILICONE IMPLANT EXCHANGE;  Surgeon: Irene Limbo, MD;  Location: [place];  Service: Plastics;  Laterality: Right;   BREAST LUMPECTOMY WITH RADIOACTIVE SEED LOCALIZATION Right 06/20/2019   Procedure: RIGHT BREAST LUMPECTOMY x2 WITH RADIOACTIVE SEED LOCALIZATION;  Surgeon: Erroll Luna, MD;   Location: [place];  Service: General;  Laterality: Right;   BREAST RECONSTRUCTION WITH PLACEMENT OF TISSUE EXPANDER AND ALLODERM Bilateral 11/27/2019   Procedure: BILATERAL BREAST RECONSTRUCTION WITH PLACEMENT OF TISSUE EXPANDER AND ALLODERM;  Surgeon: Irene Limbo, MD;  Location: [place];  Service: Plastics;  Laterality: Bilateral;   BREAST RECONSTRUCTION WITH PLACEMENT OF TISSUE EXPANDER AND ALLODERM Right 01/29/2020   Procedure: RIGHT BREAST RECONSTRUCTION WITH PLACEMENT OF TISSUE EXPANDER AND ALLODERM;  Surgeon: Irene Limbo, MD;  Location: [place];  Service: Plastics;  Laterality: Right;   BREAST SURGERY     ENDOMETRIAL ABLATION     HAND SURGERY     LIPOSUCTION WITH LIPOFILLING Bilateral 05/19/2020   Procedure: LIPOFILLING FROM ABDOMEN TO BILATERAL CHEST;  Surgeon: Irene Limbo, MD;  Location: [facility];  Service: Plastics;  Laterality: Bilateral;   REMOVAL OF TISSUE EXPANDER AND PLACEMENT OF IMPLANT Bilateral 02/22/2020   Procedure: REMOVAL OF BILATERAL TISSUE EXPANDERS AND PLACEMENT OF SILICONE IMPLANTS;  Surgeon: Irene Limbo, MD;  Location: [place];  Service: Plastics;  Laterality: Bilateral;   SIMPLE MASTECTOMY WITH AXILLARY SENTINEL NODE BIOPSY Bilateral 11/27/2019   Procedure: BILATERAL SIMPLE MASTECTOMY;  Surgeon: Erroll Luna, MD;  Location: [place];  Service: General;  Laterality: Bilateral;   TISSUE EXPANDER PLACEMENT Bilateral 12/14/2019   Procedure: REMOVAL RIGHT CHEST TISSUE EXPANDER, TISSUE EXPANSION LEFT CHEST;  Surgeon: Irene Limbo, MD;  Location: [place];  Service: Plastics;  Laterality: Bilateral;   TUMOR REMOVAL      FAMILY HISTORY: Family History  Problem Relation Age of Onset   Lupus Mother    Hypertension Father    High Cholesterol Father    Autoimmune disease Brother    Breast cancer Maternal Aunt        dx early 43s    COPD Maternal Grandmother    Dementia Maternal Grandmother    Heart failure Maternal Grandmother    Leukemia Maternal Grandfather    Lung cancer Paternal Grandmother    Cervical cancer Cousin    Lung cancer Cousin    Breast cancer Cousin        dx 8s   Breast cancer Maternal Great-grandmother    Breast cancer Other     SOCIAL HISTORY:  Social History   Socioeconomic History   Marital status: Single    Spouse name: Not on file   Number of children: Not on file   Years of education: Not on file   Highest education level: Not on file  Occupational History   Not on file  Tobacco Use   Smoking status: Former    Types: Cigarettes    Quit date: 2017    Years since quitting: 5.7   Smokeless tobacco: Never  Vaping Use   Vaping Use: Never used  Substance and Sexual Activity   Alcohol use: Yes    Comment: occas   Drug use: Never   Sexual activity: Not on file  Other Topics Concern   Not on file  Social History Narrative   Not on file   Social Determinants of Health   Financial Resource Strain: Not on file  Food Insecurity: Not on file  Transportation Needs: Not on file  Physical Activity: Not on file  Stress: Not on file  Social Connections: Not on file  Intimate Partner Violence: Not on file     PHYSICAL EXAM  Vitals:   04/07/21 1021  BP: (!) 143/97  Pulse: 86  Weight: 220 lb (99.8 kg)  Height: 5\' 4"  (1.626 m)    Body mass index is 37.76 kg/m.   General: The patient is well-developed and well-nourished and in no acute distress  HEENT:  Head is [place]/AT.  Sclera are anicteric.  Funduscopic exam shows normal optic discs and retinal vessels.  Neck: No carotid bruits are noted.  The neck is nontender.  Cardiovascular: The heart has a regular rate and rhythm with a normal S1 and S2. There were no murmurs, gallops or rubs.    Skin: Extremities are without rash or  edema.  Musculoskeletal:  Back is nontender  Neurologic Exam  Mental status: The  patient is alert and oriented x 3 at the time of the examination. The patient has apparent normal recent and remote memory, with an apparently  normal attention span and concentration ability.   Speech is normal.  Cranial nerves: Extraocular movements are full. Pupils are equal, round, and reactive to light and accomodation.  Visual fields are full.  Facial symmetry is present. There is good facial sensation to soft touch bilaterally.Facial strength is normal.  Trapezius and sternocleidomastoid strength is normal. No dysarthria is noted.  The tongue is midline, and the patient has symmetric elevation of the soft palate. No obvious hearing deficits are noted.  Motor:  Muscle bulk is normal.   Tone is normal. Strength is  5 / 5 in all 4 extremities.   Sensory: Sensory testing is intact to pinprick, soft touch and vibration sensation in all 4 extremities.   Mild reduced vibraiton in right hand vs left.   Normal in legs.  Toe vibration is normal.    Coordination: Cerebellar testing reveals good finger-nose-finger and heel-to-shin bilaterally.  Gait and station: Station is normal.   Gait is normal. Tandem gait is normal. Romberg is negative.   Reflexes: Deep tendon reflexes are symmetric and normal bilaterally.   Plantar responses are flexor.    DIAGNOSTIC DATA (LABS, IMAGING, TESTING) - I reviewed patient records, labs, notes, testing and imaging myself where available.  Lab Results  Component Value Date   WBC 9.5 11/22/2019   HGB 12.1 11/22/2019   HCT 38.5 11/22/2019   MCV 93.4 11/22/2019   PLT 310 11/22/2019      Component Value Date/Time   NA 135 02/21/2020 1200   K 3.7 02/21/2020 1200   CL 100 02/21/2020 1200   CO2 24 02/21/2020 1200   GLUCOSE 138 (H) 02/21/2020 1200   BUN 15 02/21/2020 1200   CREATININE 0.84 02/21/2020 1200   CALCIUM 9.6 02/21/2020 1200   PROT 6.7 11/22/2019 0928   ALBUMIN 4.0 11/22/2019 0928   AST 17 11/22/2019 0928   ALT 20 11/22/2019 0928   ALKPHOS 26 (L)  11/22/2019 0928   BILITOT 0.4 11/22/2019 0928   GFRNONAA >60 02/21/2020 1200   GFRAA >60 02/21/2020 1200       ASSESSMENT AND PLAN  Memory loss - Plan: Thyroid Panel With TSH, Home sleep test, MR BRAIN W WO CONTRAST  History of breast cancer - Plan: MR BRAIN W WO CONTRAST  Snoring - Plan: Home sleep test  Seizure disorder ([place]) - Plan: EEG adult  In summary, Ms. Jaffee is a 45 year old woman reporting reduced memory, verbal fluency and processing issues.  Today, she scored 27/30 on the [facility] cognitive assessment that places her in the normal range.  I discussed with her that at age 10 most people with cognitive complaints had another medical issue going on and that reduced focus/attention is causing a perception of memory and cognitive issues.  Treat however there is any atrophy or treatable structural issues, we will check an MRI of the brain.  We will do it with and without contrast as she has a history of breast pre-cancer.  She has several possible medical issues that could be playing a role.  She has a history of several generalized tonic-clonic seizures in her late teen and early 20's years and petit mal seizures that may still be occurring.  Interestingly, she reported that Tegretol made her staring spells much worse.  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  Seizures can respond to other medications.  We will check an EEG to further evaluate this.  Additionally, she has snoring and has had weight  gain.  Sleep apnea can also lead to a perception of cognitive difficulties at her age and we will check a home sleep study.  Additionally I will check thyroid function tests.  B12 was normal recently.  She will return to see me in a couple months but we will discuss the results as we get them.  She should call if she has new or worsening neurologic symptoms.  Thank you presently seeing Ms. France.  Please let me know if I can be of  further assistance with her or other patients in the future.     Shy Guallpa A. Felecia Shelling, MD, [facility] 01/26/6578, 03:83 AM Certified in Neurology, Clinical Neurophysiology, Sleep Medicine and Neuroimaging  [facility] [address] [place], [place] [address] [phone]

## 2021-04-08 ENCOUNTER — Telehealth: Payer: Self-pay | Admitting: Neurology

## 2021-04-08 LAB — THYROID PANEL WITH TSH
Free Thyroxine Index: 2.3 (ref 1.2–4.9)
T3 Uptake Ratio: 27 % (ref 24–39)
T4, Total: 8.7 ug/dL (ref 4.5–12.0)
TSH: 1.51 u[IU]/mL (ref 0.450–4.500)

## 2021-04-08 NOTE — Telephone Encounter (Signed)
[employer] order sent to GI. The patient has insurance through [facility]. They will reach out to the patient to schedule.

## 2021-04-22 ENCOUNTER — Ambulatory Visit (HOSPITAL_COMMUNITY)
Admission: RE | Admit: 2021-04-22 | Discharge: 2021-04-22 | Disposition: A | Discharge status: Home / Self Care | Payer: Managed Care, Other (non HMO) | Source: Ambulatory Visit | Attending: Neurology | Admitting: Neurology

## 2021-04-22 ENCOUNTER — Other Ambulatory Visit: Payer: Self-pay

## 2021-04-22 DIAGNOSIS — G40909 Epilepsy, unspecified, not intractable, without status epilepticus: Secondary | ICD-10-CM | POA: Insufficient documentation

## 2021-04-22 NOTE — Progress Notes (Signed)
EEG completed, results pending. 

## 2021-04-23 NOTE — Procedures (Signed)
History:  58 yof with seizure disorder here for memory complaints.   EEG classification: Normal awake and drowsy  Description of the recording: The background rhythms of this recording consists of a fairly well modulated medium amplitude alpha rhythm of 12 Hz that is reactive to eye opening and closure. As the record progresses, the patient appears to remain in the waking state throughout the recording. Photic stimulation was performed, did not show any abnormalities. Hyperventilation was also performed, did not show any abnormalities. Toward the end of the recording, the patient enters the drowsy state with slight symmetric slowing seen. The patient never enters stage II sleep. No abnormal epileptiform discharges seen during this recording. There was no focal slowing. EKG monitor shows no evidence of cardiac rhythm abnormalities with a heart rate of 78 bpm.  Impression: This is a normal EEG recording in the waking and drowsy state. No evidence of interictal epileptiform discharges seen. A normal EEG does not exclude a diagnosis of epilepsy.    Alric Ran, MD [facility]

## 2021-04-28 ENCOUNTER — Other Ambulatory Visit: Payer: Managed Care, Other (non HMO)

## 2021-04-28 ENCOUNTER — Ambulatory Visit
Admission: RE | Admit: 2021-04-28 | Discharge: 2021-04-28 | Disposition: A | Discharge status: Home / Self Care | Payer: Managed Care, Other (non HMO) | Source: Ambulatory Visit | Attending: Neurology | Admitting: Neurology

## 2021-04-28 DIAGNOSIS — Z853 Personal history of malignant neoplasm of breast: Secondary | ICD-10-CM

## 2021-04-28 DIAGNOSIS — R413 Other amnesia: Secondary | ICD-10-CM

## 2021-04-28 IMAGING — MR MR HEAD WO/W CM
14 series · 48 of 48 positions shown · IV contrast (multihance)
Comparison: None.

CLINICAL DATA: Memory loss.  Breast cancer.  Surveillance.

EXAM:
MRI HEAD WITHOUT AND WITH CONTRAST
TECHNIQUE: Multiplanar, multiecho pulse sequences of the brain and surrounding
structures were obtained without and with intravenous contrast.
CONTRAST:  20mL MULTIHANCE GADOBENATE DIMEGLUMINE 529 MG/ML IV SOLN

[Series 5: T1 · sagittal · 4.0mm · 0.75mm/px · 2 of 26 slices shown (1 of 3)]
[im 1/26]
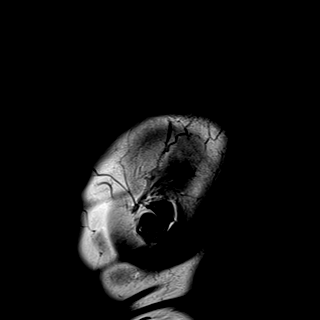
[im 26/26]
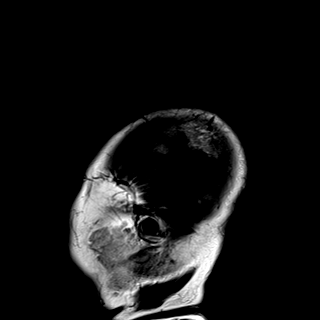

[Series 6: DWI · axial · 3.0mm · 0.94mm/px · z∈[-33,+106]mm · 9 of 160 slices shown (1 of 3)]
[im 1/160]
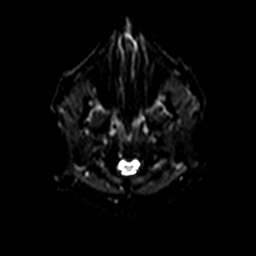
[im 20/160]
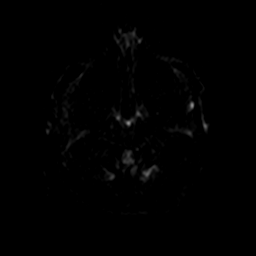
[im 40/160]
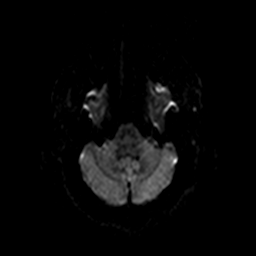
[im 60/160]
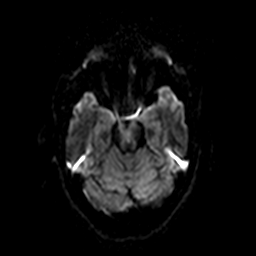
[im 80/160]
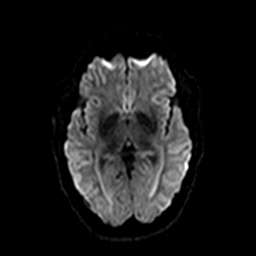
[im 100/160]
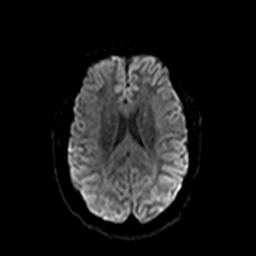
[im 120/160]
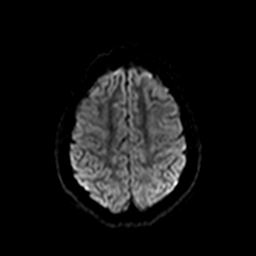
[im 140/160]
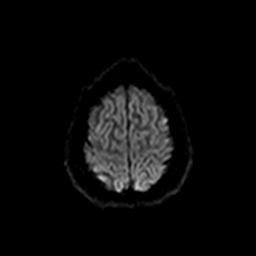
[im 160/160]
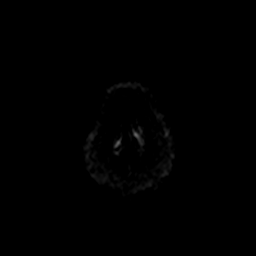

[Series 7: ax dwi_tracew · axial · 3.0mm · 0.94mm/px · z∈[-33,+106]mm · 4 of 80 slices shown]
[im 1/80]
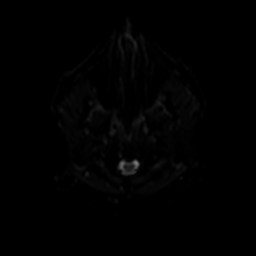
[im 27/80]
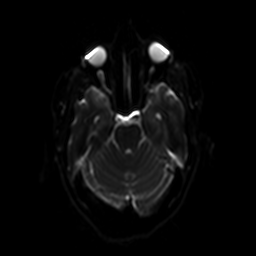
[im 53/80]
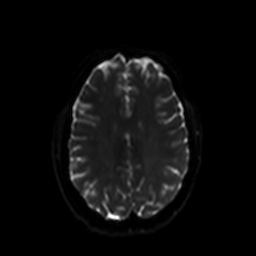
[im 80/80]
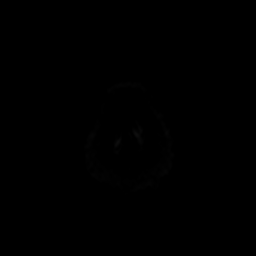

[Series 8: ax dwi_adc · axial · 3.0mm · 0.94mm/px · z∈[-33,+106]mm · 2 of 40 slices shown]
[im 1/40]
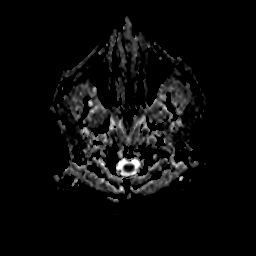
[im 40/40]
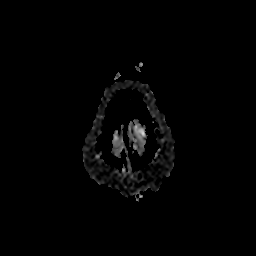

[Series 9: DWI · coronal · 5.0mm · 1.44mm/px · 3 of 62 slices shown (2 of 3)]
[im 1/62]
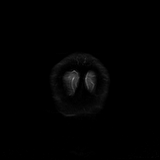
[im 31/62]
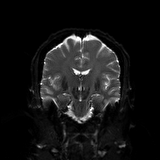
[im 62/62]
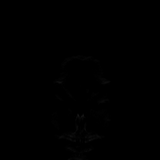

[Series 10: DWI · coronal · 5.0mm · 1.44mm/px · 2 of 31 slices shown (3 of 3)]
[im 1/31]
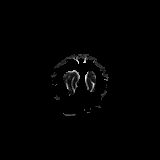
[im 31/31]
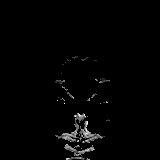

[Series 11: T2 · axial · 4.0mm · 0.36mm/px · 1 of 27 slices shown]
[im 1/27]
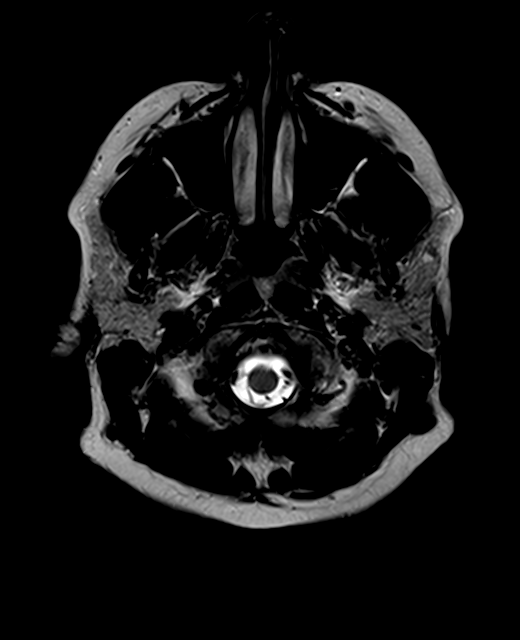

[Series 12: FLAIR · axial · 3.0mm · 0.72mm/px · 1 of 26 slices shown]
[im 1/26]
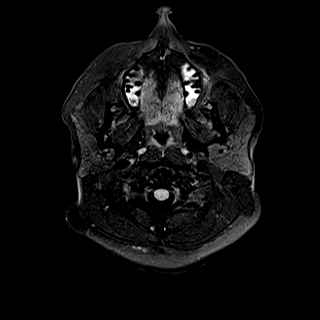

[Series 13: swi_images · axial · 1.5mm · 0.90mm/px · z∈[-38,+104]mm · 5 of 96 slices shown]
[im 1/96]
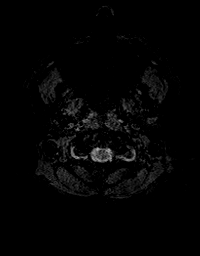
[im 24/96]
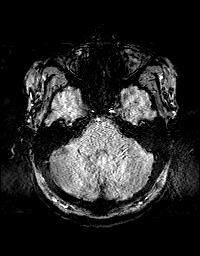
[im 48/96]
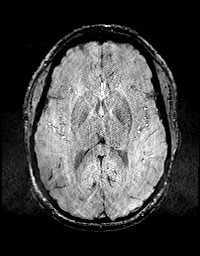
[im 72/96]
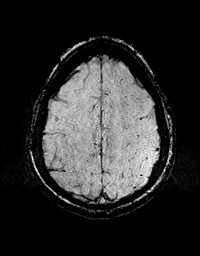
[im 96/96]
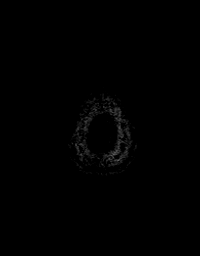

[Series 15: T1 · axial · 1.0mm · 0.94mm/px · z∈[-52,+91]mm · 7 of 144 slices shown (2 of 3)]
[im 1/144]
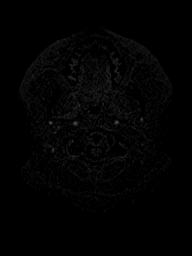
[im 24/144]
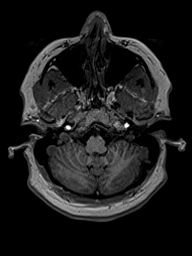
[im 48/144]
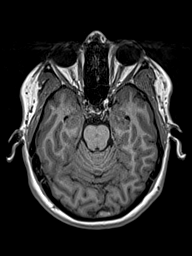
[im 72/144]
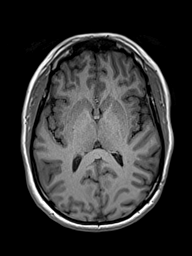
[im 96/144]
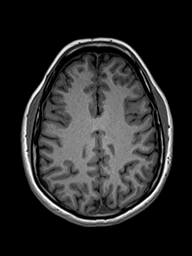
[im 120/144]
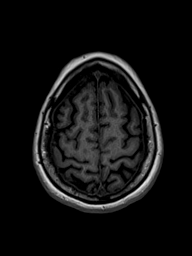
[im 144/144]
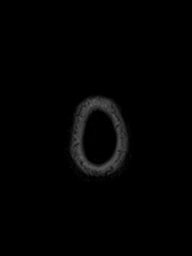

[Series 16: T2 post-contrast · coronal · 4.0mm · 0.36mm/px · 2 of 33 slices shown]
[im 1/33]
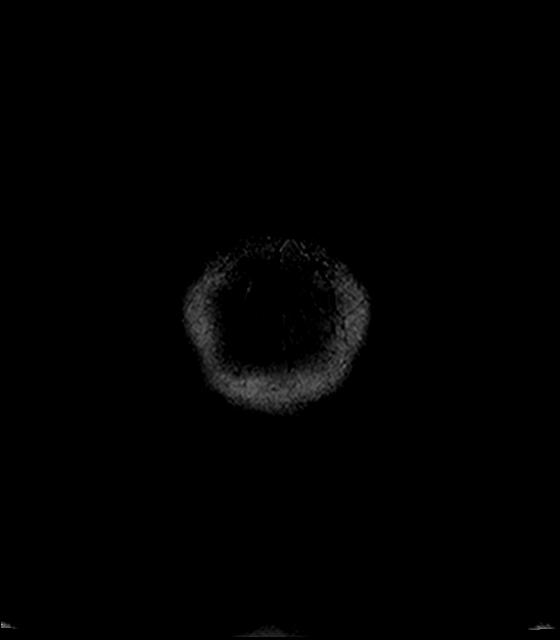
[im 33/33]
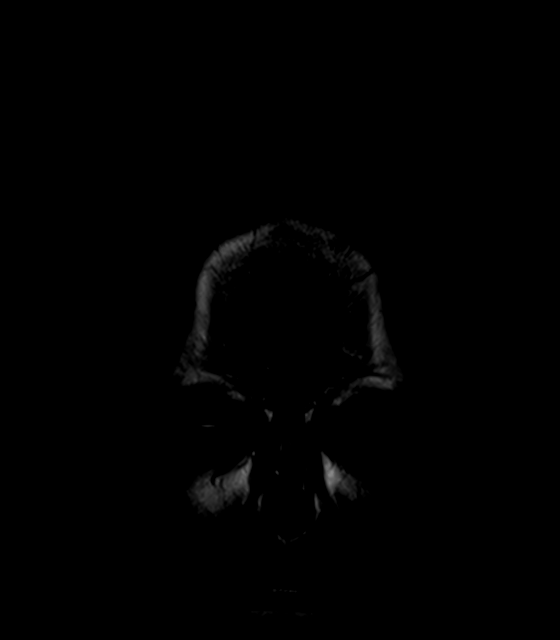

[Series 17: T1 · axial · 1.0mm · 0.94mm/px · z∈[-52,+91]mm · 7 of 144 slices shown (3 of 3)]
[im 1/144]
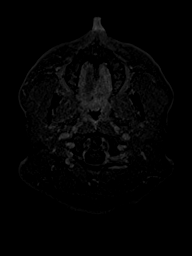
[im 24/144]
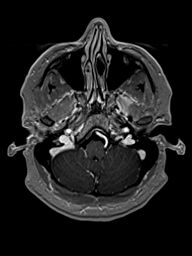
[im 48/144]
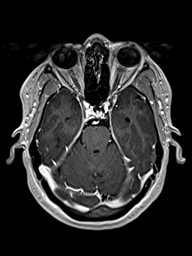
[im 72/144]
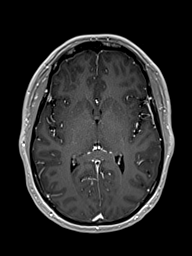
[im 96/144]
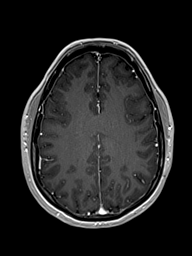
[im 120/144]
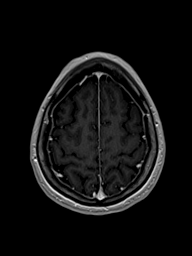
[im 144/144]
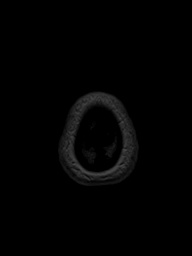

[Series 18: T1 post-contrast · coronal · 4.0mm · 0.72mm/px · 2 of 33 slices shown (1 of 2)]
[im 1/33]
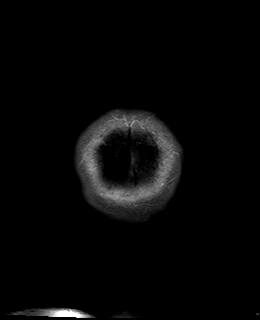
[im 33/33]
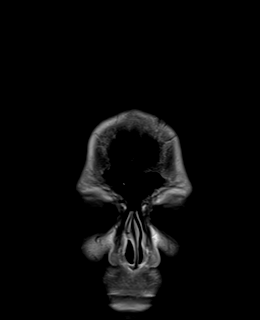

[Series 19: T1 post-contrast · sagittal · 4.0mm · 0.75mm/px · 1 of 26 slices shown (2 of 2)]
[im 1/26]
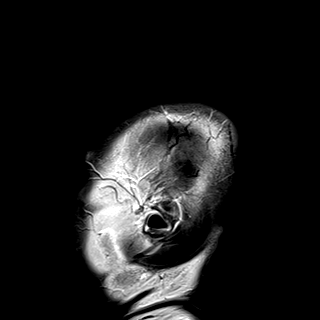

[48 of 48 positions shown; findings below may reference images not displayed]

FINDINGS: Brain: Diffusion imaging does not show any acute or subacute
infarction. No abnormality is seen affecting the brainstem or
cerebellum. Within the cerebral hemispheres, there is minimal T2 and
FLAIR signal within the deep white matter of the forceps major
region on the left, possibly related to prenatal or perinatal insult
and not likely of current clinical relevance. No sign of widespread
small-vessel disease. No evidence of mass, hemorrhage, hydrocephalus
or extra-axial collection. After contrast administration, no
abnormal enhancement occurs.

Vascular: Major vessels at the base of the brain show flow.

Skull and upper cervical spine: Negative

Sinuses/Orbits: Clear/normal

Other: None
IMPRESSION: No abnormality seen to explain the presenting symptoms. No evidence
of metastatic disease.

Minimal T2 and FLAIR signal within the forceps major region on the
left, possibly related to prenatal or perinatal insult and not
likely current clinical relevance.

## 2021-04-28 MED ORDER — GADOBENATE DIMEGLUMINE 529 MG/ML IV SOLN
20.0000 mL | Freq: Once | INTRAVENOUS | Status: AC | PRN
  Administered 2021-04-28: 20 mL via INTRAVENOUS

## 2021-05-06 ENCOUNTER — Ambulatory Visit: Payer: Managed Care, Other (non HMO) | Admitting: Neurology

## 2021-05-06 DIAGNOSIS — G4733 Obstructive sleep apnea (adult) (pediatric): Secondary | ICD-10-CM

## 2021-05-06 DIAGNOSIS — R0683 Snoring: Secondary | ICD-10-CM

## 2021-05-06 DIAGNOSIS — R413 Other amnesia: Secondary | ICD-10-CM

## 2021-05-11 NOTE — Progress Notes (Signed)
   [facility]  HOME SLEEP STUDY  STUDY DATE: 05/06/2021 PATIENT NAME: Christina Baldwin DOB: [birth year] MRN: [id number]   REFERRING CLINICIAN: Richard A. Sater, MD. PhD   CLINICAL INFORMATION: 45 year old woman with snoring, history of seizure and reduced memory  FINDINGS:  Total Record Time: 7 hours 36 minutes Total Sleep Time:  7 hours 3 minutes  Percent REM:   26.4%   Calculated pAHI:  7.7/h     REM pAHI:    16.7/h     NREM pAHI: 4.4/h  Pulse Mean:    74 bpm Pulse Range (50 to 99 bpm)    Oxygen Sat% Mean: 94%  O2Sat Range (84-98%)  O2Sat <88%: 0.5 minutes    IMPRESSION:  This home sleep study shows mild overall sleep apnea with an pAHI = 7.7.  OSA was moderate during REM sleep with a REM AHI = 16.7.   RECOMMENDATION: Her mild OSA could be treated with CPAP if she has excessive daytime sleepiness.  An oral appliance and/or weight loss could also be considered.   INTERPRETING PHYSICIAN:   Richard A. Felecia Shelling, MD, PhD, [facility] Certified in Neurology, Clinical Neurophysiology, Sleep Medicine, Pain Medicine and Neuroimaging  [facility] [address] [place], [place] [address] [phone]

## 2021-05-17 DIAGNOSIS — R0683 Snoring: Secondary | ICD-10-CM | POA: Insufficient documentation

## 2021-05-17 DIAGNOSIS — G4733 Obstructive sleep apnea (adult) (pediatric): Secondary | ICD-10-CM | POA: Insufficient documentation

## 2021-05-17 DIAGNOSIS — R413 Other amnesia: Secondary | ICD-10-CM | POA: Insufficient documentation

## 2021-07-29 ENCOUNTER — Encounter: Payer: Self-pay | Admitting: Neurology

## 2021-07-29 ENCOUNTER — Ambulatory Visit: Payer: Managed Care, Other (non HMO) | Admitting: Neurology

## 2021-07-29 VITALS — BP 185/100 | HR 82 | Ht 64.0 in | Wt 213.0 lb

## 2021-07-29 DIAGNOSIS — G4733 Obstructive sleep apnea (adult) (pediatric): Secondary | ICD-10-CM | POA: Diagnosis not present

## 2021-07-29 DIAGNOSIS — G40909 Epilepsy, unspecified, not intractable, without status epilepticus: Secondary | ICD-10-CM | POA: Diagnosis not present

## 2021-07-29 DIAGNOSIS — R413 Other amnesia: Secondary | ICD-10-CM

## 2021-07-29 MED ORDER — ARMODAFINIL 200 MG PO TABS: ORAL_TABLET | ORAL | 5 refills | Status: DC

## 2021-07-29 NOTE — Progress Notes (Signed)
[facility]  PATIENT: Christina Baldwin DOB: 1976/07/19  REFERRING DOCTOR OR PCP: Tereasa Coop, PA-C SOURCE: Patient, notes from primary care  _________________________________   HISTORICAL  CHIEF COMPLAINT:  Chief Complaint  Patient presents with   Follow-up    RM 2., alone. Sx about the same since last time. No worsening of sx.    HISTORY OF PRESENT ILLNESS:  I had the pleasure of seeing patient, Christina Baldwin, at [facility] for neurologic consultation regarding her cognitive symptoms and history of seizures  Update 07/29/2020 She feels her memory is doing about the same.   She had an EEG (normal ) and HST (mild OSA)  She sleeps about 7-8 hours most nights.    She still feels sleepy when she wakes up but better than she does in the morning.       She feel mentally tired during the day.      She works in [employer] and notes some decreased processing but is still doing her job.  She has a higher worload due to short-staffing.     She has some insomnia and takes trazodone prn.   She takes prn Robaxin, sumatriptan for migraine headaches.    Headaches are doing the same this year as they have been x years.   She snores.   She had a PSG many years ago (25 or more) ad it was fine.   She denies EDS.    She denies depression.     She Covid x 2, last time 11/2020.  She felt her memory symptoms were much worse after the second infection.     EPWORTH SLEEPINESS SCALE  On a scale of 0 - 3 what is the chance of dozing:  Sitting and Reading:   0 Watching TV:    1 Sitting inactive in a public place: 1 Passenger in car for one hour: 0 Lying down to rest in the afternoon: 2 Sitting and talking to someone: 0 Sitting quietly after lunch:  2 In a car, stopped in traffic:  0  Total (out of 24):    6/24  DATA: EEG 04/22/2021 was normal.    Home sleep study 05/06/2021 showed mild OSA (7.7).  Moderate during REM sleep (16.7).     TSH was  normal.  History of mental status changes She began to note her thinking was more foggy in late 2021.  Specifically, she noted some short term memory issues.  She sometimes repeated herself.   She noted that she repeats words or switches letters while typing.   She sometimes makes mistakes on similar words (substituting scents for sense as example).  She makes other language errors.    She feels information processing is reduced.   .  She denies difficulty with gait, balance, coordination, strength or sensation.   Vision is a little worse but felt to be due to correction.      Other neurologic: She has a h/o seizures.   She had a GTC seizure at age 50 and 71 or 4 more over the next few years.   She also had some petit mal seizures.   She had several EEG studies (last one 2000-2001) that were abnormal.   She was on phenobarbital and dilantin.  She had increases petit mal seizures with Dilantin and Tegretol She still wonders if she has petit mal seizures.        Montreal Cognitive Assessment  04/07/2021  Visuospatial/ Executive (0/5) 5  Naming (0/3) 3  Attention: Read list of digits (0/2) 2  Attention: Read list of letters (0/1) 1  Attention: Serial 7 subtraction starting at 100 (0/3) 3  Language: Repeat phrase (0/2) 0  Language : Fluency (0/1) 1  Abstraction (0/2) 2  Delayed Recall (0/5) 4  Orientation (0/6) 6  Total 27   TSH ws normal 10/02/2019.   B12 was normal 12/2020.    She is on metformin for NIDDM Type 2 (last HgbA1c was 5.6 03/24/2021 but was 6.5)  REVIEW OF SYSTEMS: Constitutional: No fevers, chills, sweats, or change in appetite Eyes: No visual changes, double vision, eye pain Ear, nose and throat: No hearing loss, ear pain, nasal congestion, sore throat Cardiovascular: No chest pain, palpitations Respiratory:  No shortness of breath at rest or with exertion.   No wheezes GastrointestinaI: No nausea, vomiting, diarrhea, abdominal pain, fecal incontinence Genitourinary:  No  dysuria, urinary retention or frequency.  No nocturia. Musculoskeletal:  No neck pain, back pain Integumentary: No rash, pruritus, skin lesions Neurological: as above Psychiatric: No depression at this time.  No anxiety Endocrine: No palpitations, diaphoresis, change in appetite, change in weigh or increased thirst Hematologic/Lymphatic:  No anemia, purpura, petechiae. Allergic/Immunologic: No itchy/runny eyes, nasal congestion, recent allergic reactions, rashes  ALLERGIES: Allergies  Allergen Reactions   Sulfa Antibiotics     Bactrim  Renal failure    Liquid Bandage [New Skin] Rash    dermabond allergy     HOME MEDICATIONS:  Current Outpatient Medications:    Armodafinil 200 MG TABS, One po qAM, Disp: 30 tablet, Rfl: 5   cloNIDine (CATAPRES) 0.1 MG tablet, Take 0.1-0.2 mg by mouth daily., Disp: , Rfl:    metFORMIN (GLUCOPHAGE) 500 MG tablet, Take 500 mg by mouth daily., Disp: , Rfl:    methocarbamol (ROBAXIN) 750 MG tablet, Take 750 mg by mouth daily as needed., Disp: , Rfl:    rosuvastatin (CRESTOR) 20 MG tablet, Take 20 mg by mouth daily., Disp: , Rfl:    SUMAtriptan (IMITREX) 50 MG tablet, Take 50 mg by mouth every 2 (two) hours as needed for migraine. May repeat in 2 hours if headache persists or recurs., Disp: , Rfl:    traZODone (DESYREL) 50 MG tablet, Take 50 mg by mouth at bedtime., Disp: , Rfl:   PAST MEDICAL HISTORY: Past Medical History:  Diagnosis Date   ADD (attention deficit disorder)    Cancer ([place])    Family history of breast cancer    Family history of cervical cancer    Family history of lung cancer    Hypertension    Mixed hyperlipidemia    Pre-diabetes     PAST SURGICAL HISTORY: Past Surgical History:  Procedure Laterality Date   APPLICATION OF A-CELL OF CHEST/ABDOMEN Right 05/19/2020   Procedure: ACELLULAR DERMIS TO RIGHT CHEST;  Surgeon: Irene Limbo, MD;  Location: [place];  Service: Plastics;  Laterality: Right;   BREAST  IMPLANT EXCHANGE Right 05/19/2020   Procedure: REVISION RIGHT BREAST RECONSTRUCTION WITH SILICONE IMPLANT EXCHANGE;  Surgeon: Irene Limbo, MD;  Location: [place];  Service: Plastics;  Laterality: Right;   BREAST LUMPECTOMY WITH RADIOACTIVE SEED LOCALIZATION Right 06/20/2019   Procedure: RIGHT BREAST LUMPECTOMY x2 WITH RADIOACTIVE SEED LOCALIZATION;  Surgeon: Erroll Luna, MD;  Location: [place];  Service: General;  Laterality: Right;   BREAST RECONSTRUCTION WITH PLACEMENT OF TISSUE EXPANDER AND ALLODERM Bilateral 11/27/2019   Procedure: BILATERAL BREAST RECONSTRUCTION WITH PLACEMENT OF TISSUE EXPANDER AND ALLODERM;  Surgeon: Thimmappa,  [name], MD;  Location: [place];  Service: Plastics;  Laterality: Bilateral;   BREAST RECONSTRUCTION WITH PLACEMENT OF TISSUE EXPANDER AND ALLODERM Right 01/29/2020   Procedure: RIGHT BREAST RECONSTRUCTION WITH PLACEMENT OF TISSUE EXPANDER AND ALLODERM;  Surgeon: Irene Limbo, MD;  Location: [place];  Service: Plastics;  Laterality: Right;   BREAST SURGERY     ENDOMETRIAL ABLATION     HAND SURGERY     LIPOSUCTION WITH LIPOFILLING Bilateral 05/19/2020   Procedure: LIPOFILLING FROM ABDOMEN TO BILATERAL CHEST;  Surgeon: Irene Limbo, MD;  Location: [place];  Service: Plastics;  Laterality: Bilateral;   REMOVAL OF TISSUE EXPANDER AND PLACEMENT OF IMPLANT Bilateral 02/22/2020   Procedure: REMOVAL OF BILATERAL TISSUE EXPANDERS AND PLACEMENT OF SILICONE IMPLANTS;  Surgeon: Irene Limbo, MD;  Location: [place];  Service: Plastics;  Laterality: Bilateral;   SIMPLE MASTECTOMY WITH AXILLARY SENTINEL NODE BIOPSY Bilateral 11/27/2019   Procedure: BILATERAL SIMPLE MASTECTOMY;  Surgeon: Erroll Luna, MD;  Location: [place];  Service: General;  Laterality: Bilateral;   TISSUE EXPANDER PLACEMENT Bilateral 12/14/2019   Procedure: REMOVAL RIGHT  CHEST TISSUE EXPANDER, TISSUE EXPANSION LEFT CHEST;  Surgeon: Irene Limbo, MD;  Location: [place];  Service: Plastics;  Laterality: Bilateral;   TUMOR REMOVAL      FAMILY HISTORY: Family History  Problem Relation Age of Onset   Lupus Mother    Hypertension Father    High Cholesterol Father    Autoimmune disease Brother    Breast cancer Maternal Aunt        dx early 5s   COPD Maternal Grandmother    Dementia Maternal Grandmother    Heart failure Maternal Grandmother    Leukemia Maternal Grandfather    Lung cancer Paternal Grandmother    Cervical cancer Cousin    Lung cancer Cousin    Breast cancer Cousin        dx 66s   Breast cancer Maternal Great-grandmother    Breast cancer Other     SOCIAL HISTORY:  Social History   Socioeconomic History   Marital status: Single    Spouse name: Not on file   Number of children: Not on file   Years of education: Not on file   Highest education level: Not on file  Occupational History   Not on file  Tobacco Use   Smoking status: Former    Types: Cigarettes    Quit date: 2017    Years since quitting: 6.0   Smokeless tobacco: Never  Vaping Use   Vaping Use: Never used  Substance and Sexual Activity   Alcohol use: Yes    Comment: occas   Drug use: Never   Sexual activity: Not on file  Other Topics Concern   Not on file  Social History Narrative   Not on file   Social Determinants of Health   Financial Resource Strain: Not on file  Food Insecurity: Not on file  Transportation Needs: Not on file  Physical Activity: Not on file  Stress: Not on file  Social Connections: Not on file  Intimate Partner Violence: Not on file     PHYSICAL EXAM  Vitals:   07/29/21 1319  BP: (!) 185/100  Pulse: 82  SpO2: 100%  Weight: 213 lb (96.6 kg)  Height: 5\' 4"  (1.626 m)    Body mass index is 36.56 kg/m.   General: The patient is well-developed and well-nourished and in no acute distress  HEENT:  Head is [place]/AT.  Sclera are anicteric.    Skin: Extremities are without rash or  edema.  Neurologic Exam  Mental status: The patient is alert and oriented x 3 at the time of the examination. The patient has apparent normal recent and remote memory, with an apparently normal attention span and concentration ability.   Speech is normal.  Cranial nerves: Extraocular movements are full.  Facial strength and sensation was normal.  No obvious hearing deficits are noted.  Motor:  Muscle bulk is normal.   Tone is normal. Strength is  5 / 5 in all 4 extremities.   Sensory: Sensory testing is intact to pinprick, soft touch and vibration sensation in all 4 extremities.     Coordination: Cerebellar testing reveals good finger-nose-finger and heel-to-shin bilaterally.  Gait and station: Station is normal.   Gait is normal. Tandem gait is normal. Romberg is negative.   Reflexes: Deep tendon reflexes are symmetric and normal bilaterally.      DIAGNOSTIC DATA (LABS, IMAGING, TESTING) - I reviewed patient records, labs, notes, testing and imaging myself where available.  Lab Results  Component Value Date   WBC 9.5 11/22/2019   HGB 12.1 11/22/2019   HCT 38.5 11/22/2019   MCV 93.4 11/22/2019   PLT 310 11/22/2019      Component Value Date/Time   NA 135 02/21/2020 1200   K 3.7 02/21/2020 1200   CL 100 02/21/2020 1200   CO2 24 02/21/2020 1200   GLUCOSE 138 (H) 02/21/2020 1200   BUN 15 02/21/2020 1200   CREATININE 0.84 02/21/2020 1200   CALCIUM 9.6 02/21/2020 1200   PROT 6.7 11/22/2019 0928   ALBUMIN 4.0 11/22/2019 0928   AST 17 11/22/2019 0928   ALT 20 11/22/2019 0928   ALKPHOS 26 (L) 11/22/2019 0928   BILITOT 0.4 11/22/2019 0928   GFRNONAA >60 02/21/2020 1200   GFRAA >60 02/21/2020 1200       ASSESSMENT AND PLAN  Memory loss  OSA (obstructive sleep apnea)  Seizure disorder ([facility])  1.   Armodafinil qAM.   If not better consider a stimulant 2.   She has mild OSA, borderline  when using the 4% rule.  She would not qualify for CPAP but an oral appliance could be considered.  Weight loss is recommended. 3.   Return in 6 months or sooner if there are new or worsening neurologic symptoms.     Christina Baldwin A. Felecia Shelling, MD, [facility] 8/84/1660, 6:30 PM Certified in Neurology, Clinical Neurophysiology, Sleep Medicine and Neuroimaging  [facility] [address], [place], [place] [address] [phone]

## 2021-11-23 ENCOUNTER — Encounter: Payer: Self-pay | Admitting: Neurology

## 2021-12-23 ENCOUNTER — Other Ambulatory Visit: Payer: Self-pay | Admitting: Physician Assistant

## 2021-12-23 DIAGNOSIS — I10 Essential (primary) hypertension: Secondary | ICD-10-CM

## 2022-01-12 ENCOUNTER — Ambulatory Visit
Admission: RE | Admit: 2022-01-12 | Discharge: 2022-01-12 | Disposition: A | Discharge status: Home / Self Care | Payer: Managed Care, Other (non HMO) | Source: Ambulatory Visit | Attending: Physician Assistant | Admitting: Physician Assistant

## 2022-01-12 DIAGNOSIS — I10 Essential (primary) hypertension: Secondary | ICD-10-CM

## 2022-02-03 ENCOUNTER — Ambulatory Visit: Payer: Managed Care, Other (non HMO) | Admitting: Neurology

## 2022-02-03 ENCOUNTER — Encounter: Payer: Self-pay | Admitting: Neurology

## 2022-02-03 VITALS — BP 123/79 | HR 74 | Ht 64.0 in | Wt 197.5 lb

## 2022-02-03 DIAGNOSIS — R419 Unspecified symptoms and signs involving cognitive functions and awareness: Secondary | ICD-10-CM | POA: Insufficient documentation

## 2022-02-03 DIAGNOSIS — G4719 Other hypersomnia: Secondary | ICD-10-CM | POA: Diagnosis not present

## 2022-02-03 DIAGNOSIS — R0683 Snoring: Secondary | ICD-10-CM | POA: Diagnosis not present

## 2022-02-03 DIAGNOSIS — R413 Other amnesia: Secondary | ICD-10-CM

## 2022-02-03 DIAGNOSIS — F988 Other specified behavioral and emotional disorders with onset usually occurring in childhood and adolescence: Secondary | ICD-10-CM

## 2022-02-03 DIAGNOSIS — G40909 Epilepsy, unspecified, not intractable, without status epilepticus: Secondary | ICD-10-CM

## 2022-02-03 MED ORDER — AMPHETAMINE-DEXTROAMPHET ER 20 MG PO CP24: 20.0000 mg | ORAL_CAPSULE | Freq: Every day | ORAL | 0 refills | Status: DC

## 2022-02-03 NOTE — Progress Notes (Signed)
[facility]  PATIENT: Christina Baldwin DOB: Jul 23, 1975  REFERRING DOCTOR OR PCP: Tereasa Coop, PA-C SOURCE: Patient, notes from primary care  _________________________________   HISTORICAL  CHIEF COMPLAINT:  Chief Complaint  Patient presents with   Follow-up    Rm 1, alone. Here to f/u for sz. Memory is still a problem. Having more short term memory loss and slow processing. Fatigue and energy level have worsen. Would like office sleep study vs home. Pt has d/c armondafinil.     HISTORY OF PRESENT ILLNESS:  I had the pleasure of seeing patient, Christina Baldwin, at [facility] for neurologic consultation regarding her cognitive symptoms and history of seizures  Update 02/03/2022 She feels her memory is doing about the same as last visit - if anything a little worse.   She had an EEG (normal ) and HST (mild OSA).  She also feels her vision is doing worse.    She sleeps about 7-8 hours most nights.    She still feels sleepy when she wakes up but better than she does in the morning.     I prescribed armodafinil last visit but she felt it was not helping and she stopped.   She also felt her HR and BP increased.   We had tried a stimulant in the past and she did better on it than the   She feel mentally tired during the day.     She reports a h/o seizures.   She last had a GTC seizure about 15 years ago.  She sometimes has episodes of altered awareness and staring lasting a few seconds.   .     She works in [employer] and notes some decreased processing but is still doing her job.   She has some insomnia and takes trazodone prn.   She takes prn Robaxin, sumatriptan for migraine headaches.    Headaches are doing the same this year as they have been x years.   She snores.  HST showed mild overall OSA (pAHI = 7.7)and moderate REM related OSA (REM AHI = 17/h)  She denies EDS.    She denies depression.      She Covid x 2, last time 11/2020.  She felt  her memory symptoms were much worse after the second infection.     EPWORTH SLEEPINESS SCALE  On a scale of 0 - 3 what is the chance of dozing:  Sitting and Reading:   2 Watching TV:    2 Sitting inactive in a public place: 2 Passenger in car for one hour: 2 Lying down to rest in the afternoon: 2 Sitting and talking to someone: 0 Sitting quietly after lunch:  2 In a car, stopped in traffic:  0  Total (out of 24):    12/24   EDS      02/03/2022    1:14 PM 04/07/2021   10:23 AM  Montreal Cognitive Assessment   Visuospatial/ Executive (0/5) 5 5  Naming (0/3) 3 3  Attention: Read list of digits (0/2) 2 2  Attention: Read list of letters (0/1) 1 1  Attention: Serial 7 subtraction starting at 100 (0/3) 3 3  Language: Repeat phrase (0/2) 2 0  Language : Fluency (0/1) 1 1  Abstraction (0/2) 2 2  Delayed Recall (0/5) 4 4  Orientation (0/6) 5 6  Total 28 27     DATA: EEG 04/22/2021 was normal.    Home sleep study 05/06/2021 showed mild OSA (7.7).  Moderate  during REM sleep (16.7).     TSH was normal.  History of mental status changes She began to note her thinking was more foggy in late 2021.  Specifically, she noted some short term memory issues.  She sometimes repeated herself.   She noted that she repeats words or switches letters while typing.   She sometimes makes mistakes on similar words (substituting scents for sense as example).  She makes other language errors.    She feels information processing is reduced.   .  She denies difficulty with gait, balance, coordination, strength or sensation.   Vision is a little worse but felt to be due to correction.      Other neurologic: She has a h/o seizures.   She had a GTC seizure at age 82 and 67 or 4 more over the next few years.   She also had some petit mal seizures.   She had several EEG studies (last one 2000-2001) that were abnormal.   She was on phenobarbital and dilantin.  She had increases petit mal seizures with  Dilantin and Tegretol She still wonders if she has petit mal seizures.           04/07/2021   10:23 AM  Montreal Cognitive Assessment   Visuospatial/ Executive (0/5) 5  Naming (0/3) 3  Attention: Read list of digits (0/2) 2  Attention: Read list of letters (0/1) 1  Attention: Serial 7 subtraction starting at 100 (0/3) 3  Language: Repeat phrase (0/2) 0  Language : Fluency (0/1) 1  Abstraction (0/2) 2  Delayed Recall (0/5) 4  Orientation (0/6) 6  Total 27   TSH ws normal 10/02/2019.   B12 was normal 12/2020.    She is on metformin for NIDDM Type 2 (last HgbA1c was 5.6 03/24/2021 but was 6.5)  REVIEW OF SYSTEMS: Constitutional: No fevers, chills, sweats, or change in appetite Eyes: No visual changes, double vision, eye pain Ear, nose and throat: No hearing loss, ear pain, nasal congestion, sore throat Cardiovascular: No chest pain, palpitations Respiratory:  No shortness of breath at rest or with exertion.   No wheezes GastrointestinaI: No nausea, vomiting, diarrhea, abdominal pain, fecal incontinence Genitourinary:  No dysuria, urinary retention or frequency.  No nocturia. Musculoskeletal:  No neck pain, back pain Integumentary: No rash, pruritus, skin lesions Neurological: as above Psychiatric: No depression at this time.  No anxiety Endocrine: No palpitations, diaphoresis, change in appetite, change in weigh or increased thirst Hematologic/Lymphatic:  No anemia, purpura, petechiae. Allergic/Immunologic: No itchy/runny eyes, nasal congestion, recent allergic reactions, rashes  ALLERGIES: Allergies  Allergen Reactions   Sulfa Antibiotics     Bactrim  Renal failure    Liquid Bandage [New Skin] Rash    dermabond allergy     HOME MEDICATIONS:  Current Outpatient Medications:    carvedilol (COREG) 12.5 MG tablet, Take 6.25 mg by mouth 2 (two) times daily., Disp: , Rfl:    Cholecalciferol (VITAMIN D3) 1.25 MG (50000 UT) CAPS, Take 1 capsule by mouth once a week., Disp: , Rfl:     hydrochlorothiazide (HYDRODIURIL) 25 MG tablet, Take 25 mg by mouth daily., Disp: , Rfl:    metFORMIN (GLUCOPHAGE) 500 MG tablet, Take 500 mg by mouth daily., Disp: , Rfl:    methocarbamol (ROBAXIN) 750 MG tablet, Take 750 mg by mouth daily as needed., Disp: , Rfl:    rosuvastatin (CRESTOR) 20 MG tablet, Take 20 mg by mouth daily., Disp: , Rfl:    SUMAtriptan (IMITREX) 50 MG  tablet, Take 50 mg by mouth every 2 (two) hours as needed for migraine. May repeat in 2 hours if headache persists or recurs., Disp: , Rfl:    telmisartan (MICARDIS) 80 MG tablet, Take 80 mg by mouth daily., Disp: , Rfl:    tirzepatide (MOUNJARO) 12.5 MG/0.5ML Pen, Inject 12.5 mg into the skin once a week., Disp: , Rfl:    traZODone (DESYREL) 50 MG tablet, Take 50 mg by mouth at bedtime., Disp: , Rfl:   PAST MEDICAL HISTORY: Past Medical History:  Diagnosis Date   ADD (attention deficit disorder)    Cancer ([place])    Family history of breast cancer    Family history of cervical cancer    Family history of lung cancer    Hypertension    Mixed hyperlipidemia    Pre-diabetes     PAST SURGICAL HISTORY: Past Surgical History:  Procedure Laterality Date   APPLICATION OF A-CELL OF CHEST/ABDOMEN Right 05/19/2020   Procedure: ACELLULAR DERMIS TO RIGHT CHEST;  Surgeon: Irene Limbo, MD;  Location: [place];  Service: Plastics;  Laterality: Right;   BREAST IMPLANT EXCHANGE Right 05/19/2020   Procedure: REVISION RIGHT BREAST RECONSTRUCTION WITH SILICONE IMPLANT EXCHANGE;  Surgeon: Irene Limbo, MD;  Location: [place];  Service: Plastics;  Laterality: Right;   BREAST LUMPECTOMY WITH RADIOACTIVE SEED LOCALIZATION Right 06/20/2019   Procedure: RIGHT BREAST LUMPECTOMY x2 WITH RADIOACTIVE SEED LOCALIZATION;  Surgeon: Erroll Luna, MD;  Location: [place];  Service: General;  Laterality: Right;   BREAST RECONSTRUCTION WITH PLACEMENT OF TISSUE EXPANDER AND ALLODERM  Bilateral 11/27/2019   Procedure: BILATERAL BREAST RECONSTRUCTION WITH PLACEMENT OF TISSUE EXPANDER AND ALLODERM;  Surgeon: Irene Limbo, MD;  Location: [place];  Service: Plastics;  Laterality: Bilateral;   BREAST RECONSTRUCTION WITH PLACEMENT OF TISSUE EXPANDER AND ALLODERM Right 01/29/2020   Procedure: RIGHT BREAST RECONSTRUCTION WITH PLACEMENT OF TISSUE EXPANDER AND ALLODERM;  Surgeon: Irene Limbo, MD;  Location: [place];  Service: Plastics;  Laterality: Right;   BREAST SURGERY     ENDOMETRIAL ABLATION     HAND SURGERY     LIPOSUCTION WITH LIPOFILLING Bilateral 05/19/2020   Procedure: LIPOFILLING FROM ABDOMEN TO BILATERAL CHEST;  Surgeon: Irene Limbo, MD;  Location: [place];  Service: Plastics;  Laterality: Bilateral;   REMOVAL OF TISSUE EXPANDER AND PLACEMENT OF IMPLANT Bilateral 02/22/2020   Procedure: REMOVAL OF BILATERAL TISSUE EXPANDERS AND PLACEMENT OF SILICONE IMPLANTS;  Surgeon: Irene Limbo, MD;  Location: [place];  Service: Plastics;  Laterality: Bilateral;   SIMPLE MASTECTOMY WITH AXILLARY SENTINEL NODE BIOPSY Bilateral 11/27/2019   Procedure: BILATERAL SIMPLE MASTECTOMY;  Surgeon: Erroll Luna, MD;  Location: [place];  Service: General;  Laterality: Bilateral;   TISSUE EXPANDER PLACEMENT Bilateral 12/14/2019   Procedure: REMOVAL RIGHT CHEST TISSUE EXPANDER, TISSUE EXPANSION LEFT CHEST;  Surgeon: Irene Limbo, MD;  Location: [place];  Service: Plastics;  Laterality: Bilateral;   TUMOR REMOVAL      FAMILY HISTORY: Family History  Problem Relation Age of Onset   Lupus Mother    Hypertension Father    High Cholesterol Father    Autoimmune disease Brother    Breast cancer Maternal Aunt        dx early 54s   COPD Maternal Grandmother    Dementia Maternal Grandmother    Heart failure Maternal Grandmother    Leukemia Maternal Grandfather    Lung  cancer Paternal  Grandmother    Cervical cancer Cousin    Lung cancer Cousin    Breast cancer Cousin        dx 56s   Breast cancer Maternal Great-grandmother    Breast cancer Other     SOCIAL HISTORY:  Social History   Socioeconomic History   Marital status: Single    Spouse name: Not on file   Number of children: Not on file   Years of education: Not on file   Highest education level: Master's degree (e.g., MA, MS, MEng, MEd, MSW, MBA)  Occupational History   Not on file  Tobacco Use   Smoking status: Former    Types: Cigarettes    Quit date: 2017    Years since quitting: 6.5   Smokeless tobacco: Never  Vaping Use   Vaping Use: Never used  Substance and Sexual Activity   Alcohol use: Yes    Comment: occas   Drug use: Never   Sexual activity: Not on file  Other Topics Concern   Not on file  Social History Narrative   Not on file   Social Determinants of Health   Financial Resource Strain: Not on file  Food Insecurity: Not on file  Transportation Needs: Not on file  Physical Activity: Not on file  Stress: Not on file  Social Connections: Not on file  Intimate Partner Violence: Not on file     PHYSICAL EXAM  Vitals:   02/03/22 1310  BP: 123/79  Pulse: 74  Weight: 197 lb 8 oz (89.6 kg)  Height: '5\' 4"'$  (1.626 m)    Body mass index is 33.9 kg/m.   General: The patient is well-developed and well-nourished and in no acute distress  HEENT:  Head is [place]/AT.  Sclera are anicteric.    Skin: Extremities are without rash or  edema.  Neurologic Exam  Mental status: The patient is alert and oriented x 3 at the time of the examination. The patient has apparent normal recent and remote memory, with an apparently normal attention span and concentration ability.   Speech is normal.   Scored 28/30 on the moCA  Cranial nerves: Extraocular movements are full.  Facial strength and sensation was normal.  No obvious hearing deficits are noted.  Motor:  Muscle bulk is  normal.   Tone is normal. Strength is  5 / 5 in all 4 extremities.   Sensory: Sensory testing is intact to pinprick, soft touch and vibration sensation in all 4 extremities.     Coordination: Cerebellar testing reveals good finger-nose-finger and heel-to-shin bilaterally.  Gait and station: Station is normal.   Gait and tandem gait are normal.  Romberg is negative.   Reflexes: Deep tendon reflexes are symmetric and normal bilaterally.      DIAGNOSTIC DATA (LABS, IMAGING, TESTING) - I reviewed patient records, labs, notes, testing and imaging myself where available.  Lab Results  Component Value Date   WBC 9.5 11/22/2019   HGB 12.1 11/22/2019   HCT 38.5 11/22/2019   MCV 93.4 11/22/2019   PLT 310 11/22/2019      Component Value Date/Time   NA 135 02/21/2020 1200   K 3.7 02/21/2020 1200   CL 100 02/21/2020 1200   CO2 24 02/21/2020 1200   GLUCOSE 138 (H) 02/21/2020 1200   BUN 15 02/21/2020 1200   CREATININE 0.84 02/21/2020 1200   CALCIUM 9.6 02/21/2020 1200   PROT 6.7 11/22/2019 0928   ALBUMIN 4.0 11/22/2019 0928   AST 17 11/22/2019 0928  ALT 20 11/22/2019 0928   ALKPHOS 26 (L) 11/22/2019 0928   BILITOT 0.4 11/22/2019 0928   GFRNONAA >60 02/21/2020 1200   GFRAA >60 02/21/2020 1200       ASSESSMENT AND PLAN  Memory loss  Snoring  Excessive daytime sleepiness  Cognitive complaints  1.   Adderall XR 20 mgl qAM    2.   She has mild OSA, borderline when using the 4% rule.  She reports worsening EDS and we will check a PSG. 3.   Return in 6 months or sooner if there are new or worsening neurologic symptoms.     Derk Doubek A. Felecia Shelling, MD, [facility] 1/79/8102, 5:48 PM Certified in Neurology, Clinical Neurophysiology, Sleep Medicine and Neuroimaging  [facility] [address] [place], [place] [address] [phone]

## 2022-02-05 ENCOUNTER — Telehealth: Payer: Self-pay | Admitting: Neurology

## 2022-02-05 NOTE — Telephone Encounter (Signed)
Christina Baldwin from [facility] is calling and want to report to provider that Pt reports that she has Epilepsy and want to know if it's okay fill prescription amphetamine-dextroamphetamine (ADDERALL XR) 20 MG 24 hr capsule

## 2022-02-08 NOTE — Telephone Encounter (Signed)
Are you ok with pharmacy filling adderall?

## 2022-02-08 NOTE — Telephone Encounter (Signed)
[facility] Christina Baldwin) want to make sure physician is aware pt has epilepsy along with Adderall. Would like a call from the nurse. Phone: [phone]   Reference [id number]

## 2022-02-08 NOTE — Telephone Encounter (Signed)
Called and spoke with pharmacy. Relayed per Dr. Felecia Shelling ok to fill adderall. They verbalized understanding and will get rx ready for pt.

## 2022-03-01 ENCOUNTER — Telehealth: Payer: Self-pay | Admitting: Neurology

## 2022-03-01 DIAGNOSIS — R0683 Snoring: Secondary | ICD-10-CM

## 2022-03-01 DIAGNOSIS — R413 Other amnesia: Secondary | ICD-10-CM

## 2022-03-01 DIAGNOSIS — G4733 Obstructive sleep apnea (adult) (pediatric): Secondary | ICD-10-CM

## 2022-03-01 NOTE — Telephone Encounter (Signed)
[employer] pending faxed notes  

## 2022-03-04 NOTE — Telephone Encounter (Signed)
Order for HST placed for the patient

## 2022-03-04 NOTE — Telephone Encounter (Signed)
[employer] did not approve the NPSG- would you be okay with ordering a HST?

## 2022-03-11 NOTE — Telephone Encounter (Signed)
[employer] no auth req for HST spoke to [place] ref # [id number]  pt had a HST 6 months ago does feel she needs anotherone & will discuss w/Dr. Felecia Shelling

## 2022-03-24 ENCOUNTER — Ambulatory Visit
Admission: RE | Admit: 2022-03-24 | Discharge: 2022-03-24 | Disposition: A | Discharge status: Home / Self Care | Payer: Managed Care, Other (non HMO) | Source: Ambulatory Visit | Attending: Physician Assistant | Admitting: Physician Assistant

## 2022-03-24 ENCOUNTER — Other Ambulatory Visit: Payer: Self-pay | Admitting: Physician Assistant

## 2022-03-24 DIAGNOSIS — M5412 Radiculopathy, cervical region: Secondary | ICD-10-CM

## 2022-04-15 ENCOUNTER — Other Ambulatory Visit: Payer: Self-pay | Admitting: Sports Medicine

## 2022-04-15 DIAGNOSIS — M25811 Other specified joint disorders, right shoulder: Secondary | ICD-10-CM

## 2022-04-15 DIAGNOSIS — M5412 Radiculopathy, cervical region: Secondary | ICD-10-CM

## 2022-05-05 ENCOUNTER — Ambulatory Visit
Admission: RE | Admit: 2022-05-05 | Discharge: 2022-05-05 | Disposition: A | Discharge status: Home / Self Care | Payer: Managed Care, Other (non HMO) | Source: Ambulatory Visit | Attending: Sports Medicine | Admitting: Sports Medicine

## 2022-05-05 DIAGNOSIS — M25811 Other specified joint disorders, right shoulder: Secondary | ICD-10-CM

## 2022-05-05 DIAGNOSIS — M5412 Radiculopathy, cervical region: Secondary | ICD-10-CM

## 2022-05-11 ENCOUNTER — Other Ambulatory Visit: Payer: Self-pay | Admitting: Neurology

## 2022-05-11 MED ORDER — AMPHETAMINE-DEXTROAMPHET ER 20 MG PO CP24: 20.0000 mg | ORAL_CAPSULE | Freq: Every day | ORAL | 0 refills | Status: DC

## 2022-06-17 ENCOUNTER — Ambulatory Visit: Payer: Managed Care, Other (non HMO) | Admitting: Neurology

## 2022-06-17 ENCOUNTER — Telehealth: Payer: Self-pay | Admitting: *Deleted

## 2022-06-17 ENCOUNTER — Encounter: Payer: Self-pay | Admitting: Neurology

## 2022-06-17 VITALS — BP 138/96 | HR 88 | Ht 64.0 in | Wt 186.5 lb

## 2022-06-17 DIAGNOSIS — G4733 Obstructive sleep apnea (adult) (pediatric): Secondary | ICD-10-CM

## 2022-06-17 DIAGNOSIS — R413 Other amnesia: Secondary | ICD-10-CM

## 2022-06-17 DIAGNOSIS — G40909 Epilepsy, unspecified, not intractable, without status epilepticus: Secondary | ICD-10-CM

## 2022-06-17 DIAGNOSIS — F988 Other specified behavioral and emotional disorders with onset usually occurring in childhood and adolescence: Secondary | ICD-10-CM

## 2022-06-17 DIAGNOSIS — G4719 Other hypersomnia: Secondary | ICD-10-CM

## 2022-06-17 DIAGNOSIS — R404 Transient alteration of awareness: Secondary | ICD-10-CM

## 2022-06-17 MED ORDER — AMPHETAMINE-DEXTROAMPHET ER 20 MG PO CP24: 20.0000 mg | ORAL_CAPSULE | Freq: Every day | ORAL | 0 refills | Status: DC

## 2022-06-17 NOTE — Telephone Encounter (Signed)
Faxed completed form to [facility] to schedule pt for in-home 72-hour EEG. Fax: [phone]. Received confirmation.

## 2022-06-17 NOTE — Progress Notes (Signed)
[facility]  PATIENT: Christina Baldwin DOB: 02/17/1976  REFERRING DOCTOR OR PCP: Tereasa Coop, PA-C SOURCE: Patient, notes from primary care  _________________________________   HISTORICAL  CHIEF COMPLAINT:  Chief Complaint  Patient presents with   Follow-up    Pt in 2 Pt here seizure f/u Pt states seizure last Saturday . Pt states she couldn't move . Pt states didn't hit her head Pt states seizure woke her out of her sleep . No ED visit required     HISTORY OF PRESENT ILLNESS:   Christina Baldwin is a 46 y.o. woman with cognitive symptoms and history of seizures  Update 06/17/2022 She had an event while in bed at night last week - she woke up next to the wall and felt there was electricity like sensation running through her and she could not move.   This resolved and she felt wobbly when he got out of bed to use the bathroom.    She fetl a little weaker for anoter 1-2 days.     She reports a h/o seizures.   She last had a GTC seizure around 2008.    She sometimes has episodes of altered awareness and staring lasting a few seconds --- these occur 2-3 times a day on average .      She had an EEG (05/09/2021 normal ).    We discussed a video EEG   She notes some difficulty with short term memory.   She sleeps about 7-8 hours most nights.    She still feels sleepy when she wakes up but better than she does in the morning.     Adderall had helped the mental fog some.   She works in [employer] and notes some decreased processing but is still doing her job.   She has some insomnia and takes trazodone prn.   She takes prn Robaxin, sumatriptan for migraine headaches.    Headaches are doing the same this year as they have been x years.   She snores.  HST showed mild overall OSA (pAHI = 7.7) and moderate REM related OSA (REM AHI = 17/h)  She denies EDS.    She denies depression.      She Covid x 2, last time 11/2020.  She felt her memory symptoms were much worse after the  second infection.    She has right sided sciatica and had an ESI with some benefit.    Gabapentin had not helped much.    EPWORTH SLEEPINESS SCALE  On a scale of 0 - 3 what is the chance of dozing:  Sitting and Reading:   2 Watching TV:    3 Sitting inactive in a public place: 2 Passenger in car for one hour: 2 Lying down to rest in the afternoon: 2 Sitting and talking to someone: 0 Sitting quietly after lunch:  2 In a car, stopped in traffic:  0  Total (out of 24):    13/24   mild EDS      02/03/2022    1:14 PM 04/07/2021   10:23 AM  Montreal Cognitive Assessment   Visuospatial/ Executive (0/5) 5 5  Naming (0/3) 3 3  Attention: Read list of digits (0/2) 2 2  Attention: Read list of letters (0/1) 1 1  Attention: Serial 7 subtraction starting at 100 (0/3) 3 3  Language: Repeat phrase (0/2) 2 0  Language : Fluency (0/1) 1 1  Abstraction (0/2) 2 2  Delayed Recall (0/5) 4 4  Orientation (  0/6) 5 6  Total 28 27     DATA: EEG 04/22/2021 was normal.    Home sleep study 05/06/2021 showed mild OSA (7.7).  Moderate during REM sleep (16.7).     TSH was normal.  History of mental status changes She began to note her thinking was more foggy in late 2021.  Specifically, she noted some short term memory issues.  She sometimes repeated herself.   She noted that she repeats words or switches letters while typing.   She sometimes makes mistakes on similar words (substituting scents for sense as example).  She makes other language errors.    She feels information processing is reduced.   .  She denies difficulty with gait, balance, coordination, strength or sensation.   Vision is a little worse but felt to be due to correction.      Other neurologic: She has a h/o seizures.   She had a GTC seizure at age 89 and 84 or 4 more over the next few years.   She also had some petit mal seizures.   She had several EEG studies (last one 2000-2001) that were abnormal.   She was on phenobarbital and  dilantin.  She had increases petit mal seizures with Dilantin and Tegretol She still wonders if she has petit mal seizures.           02/03/2022    1:14 PM 04/07/2021   10:23 AM  Montreal Cognitive Assessment   Visuospatial/ Executive (0/5) 5 5  Naming (0/3) 3 3  Attention: Read list of digits (0/2) 2 2  Attention: Read list of letters (0/1) 1 1  Attention: Serial 7 subtraction starting at 100 (0/3) 3 3  Language: Repeat phrase (0/2) 2 0  Language : Fluency (0/1) 1 1  Abstraction (0/2) 2 2  Delayed Recall (0/5) 4 4  Orientation (0/6) 5 6  Total 28 27   TSH ws normal 10/02/2019.   B12 was normal 12/2020.    She is on metformin for NIDDM Type 2 (last HgbA1c was 5.6 03/24/2021 but was 6.5)  REVIEW OF SYSTEMS: Constitutional: No fevers, chills, sweats, or change in appetite Eyes: No visual changes, double vision, eye pain Ear, nose and throat: No hearing loss, ear pain, nasal congestion, sore throat Cardiovascular: No chest pain, palpitations Respiratory:  No shortness of breath at rest or with exertion.   No wheezes GastrointestinaI: No nausea, vomiting, diarrhea, abdominal pain, fecal incontinence Genitourinary:  No dysuria, urinary retention or frequency.  No nocturia. Musculoskeletal:  No neck pain, back pain Integumentary: No rash, pruritus, skin lesions Neurological: as above Psychiatric: No depression at this time.  No anxiety Endocrine: No palpitations, diaphoresis, change in appetite, change in weigh or increased thirst Hematologic/Lymphatic:  No anemia, purpura, petechiae. Allergic/Immunologic: No itchy/runny eyes, nasal congestion, recent allergic reactions, rashes  ALLERGIES: Allergies  Allergen Reactions   Sulfa Antibiotics     Bactrim  Renal failure    Liquid Bandage [New Skin] Rash    dermabond allergy     HOME MEDICATIONS:  Current Outpatient Medications:    carvedilol (COREG) 12.5 MG tablet, Take 6.25 mg by mouth 2 (two) times daily., Disp: , Rfl:     Cholecalciferol (VITAMIN D3) 1.25 MG (50000 UT) CAPS, Take 1 capsule by mouth once a week., Disp: , Rfl:    gabapentin (NEURONTIN) 300 MG capsule, Take by mouth daily at 2 PM., Disp: , Rfl:    hydrochlorothiazide (HYDRODIURIL) 25 MG tablet, Take 25 mg by mouth  daily., Disp: , Rfl:    metFORMIN (GLUCOPHAGE) 500 MG tablet, Take 500 mg by mouth daily., Disp: , Rfl:    methocarbamol (ROBAXIN) 750 MG tablet, Take 750 mg by mouth daily as needed., Disp: , Rfl:    rosuvastatin (CRESTOR) 20 MG tablet, Take 20 mg by mouth daily., Disp: , Rfl:    SUMAtriptan (IMITREX) 50 MG tablet, Take 50 mg by mouth every 2 (two) hours as needed for migraine. May repeat in 2 hours if headache persists or recurs., Disp: , Rfl:    telmisartan (MICARDIS) 80 MG tablet, Take 80 mg by mouth daily., Disp: , Rfl:    tirzepatide (MOUNJARO) 12.5 MG/0.5ML Pen, Inject 12.5 mg into the skin once a week., Disp: , Rfl:    amphetamine-dextroamphetamine (ADDERALL XR) 20 MG 24 hr capsule, Take 1 capsule (20 mg total) by mouth daily., Disp: 90 capsule, Rfl: 0   traZODone (DESYREL) 50 MG tablet, Take 50 mg by mouth at bedtime., Disp: , Rfl:   PAST MEDICAL HISTORY: Past Medical History:  Diagnosis Date   ADD (attention deficit disorder)    Cancer ([place])    Family history of breast cancer    Family history of cervical cancer    Family history of lung cancer    Hypertension    Mixed hyperlipidemia    Pre-diabetes     PAST SURGICAL HISTORY: Past Surgical History:  Procedure Laterality Date   APPLICATION OF A-CELL OF CHEST/ABDOMEN Right 05/19/2020   Procedure: ACELLULAR DERMIS TO RIGHT CHEST;  Surgeon: Irene Limbo, MD;  Location: [place];  Service: Plastics;  Laterality: Right;   BREAST IMPLANT EXCHANGE Right 05/19/2020   Procedure: REVISION RIGHT BREAST RECONSTRUCTION WITH SILICONE IMPLANT EXCHANGE;  Surgeon: Irene Limbo, MD;  Location: [place];  Service: Plastics;  Laterality: Right;    BREAST LUMPECTOMY WITH RADIOACTIVE SEED LOCALIZATION Right 06/20/2019   Procedure: RIGHT BREAST LUMPECTOMY x2 WITH RADIOACTIVE SEED LOCALIZATION;  Surgeon: Erroll Luna, MD;  Location: [place];  Service: General;  Laterality: Right;   BREAST RECONSTRUCTION WITH PLACEMENT OF TISSUE EXPANDER AND ALLODERM Bilateral 11/27/2019   Procedure: BILATERAL BREAST RECONSTRUCTION WITH PLACEMENT OF TISSUE EXPANDER AND ALLODERM;  Surgeon: Irene Limbo, MD;  Location: [place];  Service: Plastics;  Laterality: Bilateral;   BREAST RECONSTRUCTION WITH PLACEMENT OF TISSUE EXPANDER AND ALLODERM Right 01/29/2020   Procedure: RIGHT BREAST RECONSTRUCTION WITH PLACEMENT OF TISSUE EXPANDER AND ALLODERM;  Surgeon: Irene Limbo, MD;  Location: [place];  Service: Plastics;  Laterality: Right;   BREAST SURGERY     ENDOMETRIAL ABLATION     HAND SURGERY     LIPOSUCTION WITH LIPOFILLING Bilateral 05/19/2020   Procedure: LIPOFILLING FROM ABDOMEN TO BILATERAL CHEST;  Surgeon: Irene Limbo, MD;  Location: [place];  Service: Plastics;  Laterality: Bilateral;   REMOVAL OF TISSUE EXPANDER AND PLACEMENT OF IMPLANT Bilateral 02/22/2020   Procedure: REMOVAL OF BILATERAL TISSUE EXPANDERS AND PLACEMENT OF SILICONE IMPLANTS;  Surgeon: Irene Limbo, MD;  Location: [place];  Service: Plastics;  Laterality: Bilateral;   SIMPLE MASTECTOMY WITH AXILLARY SENTINEL NODE BIOPSY Bilateral 11/27/2019   Procedure: BILATERAL SIMPLE MASTECTOMY;  Surgeon: Erroll Luna, MD;  Location: [place];  Service: General;  Laterality: Bilateral;   TISSUE EXPANDER PLACEMENT Bilateral 12/14/2019   Procedure: REMOVAL RIGHT CHEST TISSUE EXPANDER, TISSUE EXPANSION LEFT CHEST;  Surgeon: Irene Limbo, MD;  Location: [place];  Service: Plastics;  Laterality: Bilateral;  TUMOR REMOVAL      FAMILY HISTORY: Family History   Problem Relation Age of Onset   Lupus Mother    Hypertension Father    High Cholesterol Father    Autoimmune disease Brother    Breast cancer Maternal Aunt        dx early 38s   COPD Maternal Grandmother    Dementia Maternal Grandmother    Heart failure Maternal Grandmother    Leukemia Maternal Grandfather    Lung cancer Paternal Grandmother    Cervical cancer Cousin    Lung cancer Cousin    Breast cancer Cousin        dx 54s   Breast cancer Maternal Great-grandmother    Breast cancer Other    Seizures Neg Hx     SOCIAL HISTORY:  Social History   Socioeconomic History   Marital status: Single    Spouse name: Not on file   Number of children: Not on file   Years of education: Not on file   Highest education level: Master's degree (e.g., MA, MS, MEng, MEd, MSW, MBA)  Occupational History   Not on file  Tobacco Use   Smoking status: Former    Types: Cigarettes    Quit date: 2017    Years since quitting: 6.9   Smokeless tobacco: Never  Vaping Use   Vaping Use: Never used  Substance and Sexual Activity   Alcohol use: Yes    Comment: occas   Drug use: Never   Sexual activity: Not on file  Other Topics Concern   Not on file  Social History Narrative   Not on file   Social Determinants of Health   Financial Resource Strain: Not on file  Food Insecurity: Not on file  Transportation Needs: Not on file  Physical Activity: Not on file  Stress: Not on file  Social Connections: Not on file  Intimate Partner Violence: Not on file     PHYSICAL EXAM  Vitals:   06/17/22 1421  BP: (!) 138/96  Pulse: 88  Weight: 186 lb 8 oz (84.6 kg)  Height: '5\' 4"'$  (1.626 m)    Body mass index is 32.01 kg/m.   General: The patient is well-developed and well-nourished and in no acute distress  HEENT:  Head is [place]/AT.  Sclera are anicteric.    Skin: Extremities are without rash or  edema.  Neurologic Exam  Mental status: The patient is alert and oriented x 3 at the time  of the examination. The patient has apparent normal recent and remote memory, with an apparently normal attention span and concentration ability.   Speech is normal.     Cranial nerves: Extraocular movements are full.  Facial strength and sensation was normal.  No obvious hearing deficits are noted.  Motor:  Muscle bulk is normal.   Tone is normal. Strength is  5 / 5 in all 4 extremities.   Sensory: Sensory testing is intact to pinprick, soft touch and vibration sensation in all 4 extremities.     Coordination: Cerebellar testing reveals good finger-nose-finger and heel-to-shin bilaterally.  Gait and station: Station is normal.   Gait and tandem gait are normal.  Romberg is negative.  Reflexes: Deep tendon reflexes are symmetric and normal bilaterally.      DIAGNOSTIC DATA (LABS, IMAGING, TESTING) - I reviewed patient records, labs, notes, testing and imaging myself where available.  Lab Results  Component Value Date   WBC 9.5 11/22/2019   HGB 12.1 11/22/2019  HCT 38.5 11/22/2019   MCV 93.4 11/22/2019   PLT 310 11/22/2019      Component Value Date/Time   NA 135 02/21/2020 1200   K 3.7 02/21/2020 1200   CL 100 02/21/2020 1200   CO2 24 02/21/2020 1200   GLUCOSE 138 (H) 02/21/2020 1200   BUN 15 02/21/2020 1200   CREATININE 0.84 02/21/2020 1200   CALCIUM 9.6 02/21/2020 1200   PROT 6.7 11/22/2019 0928   ALBUMIN 4.0 11/22/2019 0928   AST 17 11/22/2019 0928   ALT 20 11/22/2019 0928   ALKPHOS 26 (L) 11/22/2019 0928   BILITOT 0.4 11/22/2019 0928   GFRNONAA >60 02/21/2020 1200   GFRAA >60 02/21/2020 1200       ASSESSMENT AND PLAN  Seizure disorder (HCC)  Memory loss  Attention deficit disorder (ADD) in adult  OSA (obstructive sleep apnea) - Plan: Home sleep test  Excessive daytime sleepiness - Plan: Home sleep test  Transient alteration of awareness  1.   Continue Adderall XR 20 mgl qAM    2.   She had mild OSA, but only borderline when using the 4% rule, in  the past.  She reports worsening EDS since the study and we will check a home sleep study.  Because she does have excessive daytime sleepiness, she is interested in CPAP if she has OSA.. 3.   We will check a 72-hour ambulatory EEG to better assess the episodes of altered awareness.  If these are seizure activity, I will begin an anti-epileptic agent Return in 6 months or sooner if there are new or worsening neurologic symptoms.     Christina Mamaril A. Felecia Shelling, MD, [facility] 17/79/3903, 0:09 PM Certified in Neurology, Clinical Neurophysiology, Sleep Medicine and Neuroimaging  [facility] [address] [place], [place] [address] [phone]

## 2022-06-21 ENCOUNTER — Encounter: Payer: Self-pay | Admitting: Neurology

## 2022-06-22 ENCOUNTER — Other Ambulatory Visit: Payer: Self-pay | Admitting: *Deleted

## 2022-06-22 MED ORDER — AMPHETAMINE-DEXTROAMPHET ER 20 MG PO CP24: 20.0000 mg | ORAL_CAPSULE | Freq: Every day | ORAL | 0 refills | Status: DC

## 2022-06-22 NOTE — Telephone Encounter (Signed)
Office note faxed to [employer] per request by Elenor Legato. Received a receipt of confirmation.

## 2022-06-29 NOTE — Telephone Encounter (Signed)
Update from [facility]:   "Christina Baldwin is scheduled for January 16th for her EEG. Also, Christina Baldwin informed us that she had completed a routine EEG in the office. Would you be able to send me a copy of the routine report?"  I faxed routine EEG report to them at [phone]. Received fax confirmation.

## 2022-07-01 ENCOUNTER — Ambulatory Visit: Payer: Managed Care, Other (non HMO) | Admitting: Neurology

## 2022-07-15 ENCOUNTER — Encounter: Payer: Self-pay | Admitting: Neurology

## 2022-08-05 NOTE — Telephone Encounter (Signed)
Called to check on status of results with [facility] at [phone]. Spoke w Elenor Legato. States pt r/s from 08/03/22 to 08/14/22.

## 2022-08-10 ENCOUNTER — Ambulatory Visit: Payer: Managed Care, Other (non HMO) | Admitting: Neurology

## 2022-08-10 DIAGNOSIS — G4733 Obstructive sleep apnea (adult) (pediatric): Secondary | ICD-10-CM

## 2022-08-10 DIAGNOSIS — G4719 Other hypersomnia: Secondary | ICD-10-CM

## 2022-08-12 NOTE — Progress Notes (Signed)
   [facility]  HOME SLEEP STUDY  STUDY DATE: 08/10/2022 PATIENT NAME: Christina Baldwin DOB: 1975-12-18 MRN: [id number]  ORDERING CLINICIAN: Richard A. Felecia Shelling, MD, PhD   CLINICAL INFORMATION: 47 year old woman with obstructive sleep apnea and excessive daytime sleepiness (Epworth sleepiness scale is 13/24)  IMPRESSION:  Mild obstructive sleep apnea (pAHI 3%= 6.8/h) Normal sleep efficiency of 89%   RECOMMENDATION: As she also has excessive daytime sleepiness, AutoPap 5-15 cm H2O with heated humidifier and mask of choice could be ordered.   Alternatively, weight loss and/or an oral appliance could be considered Follow-up with Dr. Felecia Shelling   INTERPRETING PHYSICIAN:   Nanine Means. Felecia Shelling, MD, PhD, [facility] Certified in Neurology, Clinical Neurophysiology, Sleep Medicine, Pain Medicine and Neuroimaging  [facility] [address], [place], [place] [address] [phone]

## 2022-08-14 DIAGNOSIS — R569 Unspecified convulsions: Secondary | ICD-10-CM

## 2022-08-17 DIAGNOSIS — R569 Unspecified convulsions: Secondary | ICD-10-CM

## 2022-08-18 ENCOUNTER — Telehealth: Payer: Self-pay

## 2022-08-18 ENCOUNTER — Other Ambulatory Visit: Payer: Self-pay | Admitting: *Deleted

## 2022-08-18 DIAGNOSIS — G4733 Obstructive sleep apnea (adult) (pediatric): Secondary | ICD-10-CM

## 2022-08-18 NOTE — Telephone Encounter (Signed)
Please Read Message from today 08/18/2022.

## 2022-08-23 ENCOUNTER — Telehealth: Payer: Self-pay

## 2022-08-23 NOTE — Telephone Encounter (Signed)
Please see message from today 08/23/2022

## 2022-08-23 NOTE — Telephone Encounter (Signed)
Called [facility] at [phone]. Spoke w/ Hanza. Provided fax# [phone] to fax ambulatory EEG report to.

## 2022-08-29 ENCOUNTER — Other Ambulatory Visit: Payer: Self-pay | Admitting: Neurology

## 2022-08-29 ENCOUNTER — Encounter: Payer: Self-pay | Admitting: Neurology

## 2022-08-29 DIAGNOSIS — G40909 Epilepsy, unspecified, not intractable, without status epilepticus: Secondary | ICD-10-CM

## 2022-08-29 NOTE — Procedures (Signed)
Clinical History: This is a 46y/o F who presents with cognitive symptoms and history of seizure. She had an event whereby she woke up next to the wall and felt there was electricity sensations going through her and she could not move. She had her last GTC around 2008. She sometimes has episode of altered awareness and staring lasting a few seconds. These occur 2-3 times a day on average.  INTERMITTENT MONITORING with VIDEO TECHNICAL SUMMARY: This AVEEG was performed using equipment provided by [employer] utilizing Bluetooth ( [employer] ) amplifiers with continuous EEGT attended video collection using encrypted remote transmission via [place] secured cellular tower network with data rates for each AVEEG performed. This is a Biomedical engineer AVEEG, obtained, according to the 10-20 international electrode placement system, reformatted digitally into referential and bipolar montages. Data was acquired with a minimum of 21 bipolar connections and sampled at a minimum rate of 250 cycles per second per channel, maximum rate of 450 cycles per second per channel and two channels for EKG. The entire VEEG study was recorded through cable and or radio telemetry for subsequent analysis. Specified epochs of the AVEEG data were identified at the direction of the subject by the depression of a push button by the patient. Each patients event file included data acquired two minutes prior to the push button activation and continuing until two minutes afterwards. AVEEG files were reviewed on [place], Licensed Software provided by [employer] with a digital high frequency filter set at 70 Hz and a low frequency filter set at 1 Hz with a paper speed of 51m/s resulting in 10 seconds per digital page. This entire AVEEG was reviewed by the EEG Technologist. Random time samples, random sleep samples, clips, patient initiated push button files with included patient daily diary logs, EEG Technologist  pruned data was reviewed and verified for accuracy and validity by the governing reading neurologist in full details. This AEEGV was fully compliant with all requirements for CPT 97500 for setup, patient education, take down and administered by an EEG technologist. Long-Term EEG with Video was monitored intermittently by a qualified EEG technologist for the entirety of the recording; quality check-ins were performed at a minimum of every two hours, checking and documenting real-time data and video to assure the integrity and quality of the recording (e.g., camera position, electrode integrity and impedance), and identify the need for maintenance. For intermittent monitoring, an EEG Technologist monitored no more than 12 patients concurrently. Diagnostic video was captured at least 80% of the time during the recording.  PATIENT EVENTS: There were no patient events noted or captured during this recording.  TECHNOLOGIST EVENTS: No clear epileptiform activity was detected by the reviewing neurodiagnostic technologist during the recording for further evaluation.  TIME SAMPLES: 10-minutes of every 2 hours recorded are reviewed as random time samples.  SLEEP SAMPLES: 5-minutes of every 24 hours recorded are reviewed as random sleep samples.  AWAKE: At maximal level of alertness, the posterior dominant background activity was continuous, reactive, low voltage rhythm of 8-9 Hz. This was symmetric, well-modulated, and attenuated with eye opening. Diffuse, symmetric, frontocentral beta range activity was present.  SLEEP: N1 Sleep (Stage 1) was observed and characterized by the disappearance of alpha rhythm and the appearance of vertex activity. N2 Sleep (Stage 2) was observed and characterized by vertex waves, K-complexes, and sleep spindles. N3 (Stage 3) sleep was observed and characterized by high amplitude Delta activity of 20%. REM sleep was observed.  EKG: There were no  arrhythmias or  abnormalities noted during this recording.   Impression: This is a normal 72 hours ambulatory video EEG. There were no seizures or epileptiform discharges seen during this recording. Normal EEG, however does not rule out epilepsy.   Alric Ran, MD [facility]

## 2022-10-19 ENCOUNTER — Telehealth: Payer: Self-pay | Admitting: Neurology

## 2022-10-19 NOTE — Telephone Encounter (Signed)
LVM, sent mychart msg informing pt of appt change- MD out.

## 2022-11-15 ENCOUNTER — Ambulatory Visit: Payer: Managed Care, Other (non HMO) | Admitting: Neurology

## 2022-11-16 ENCOUNTER — Other Ambulatory Visit: Payer: Self-pay | Admitting: Neurology

## 2022-11-16 ENCOUNTER — Encounter: Payer: Self-pay | Admitting: Neurology

## 2022-11-16 MED ORDER — AMPHETAMINE-DEXTROAMPHET ER 20 MG PO CP24: 20.0000 mg | ORAL_CAPSULE | Freq: Every day | ORAL | 0 refills | Status: DC

## 2022-11-17 ENCOUNTER — Ambulatory Visit: Payer: Managed Care, Other (non HMO) | Admitting: Neurology

## 2022-12-01 ENCOUNTER — Ambulatory Visit: Payer: Managed Care, Other (non HMO) | Admitting: Neurology

## 2022-12-01 ENCOUNTER — Telehealth: Payer: Self-pay | Admitting: Neurology

## 2022-12-01 ENCOUNTER — Encounter: Payer: Self-pay | Admitting: Neurology

## 2022-12-01 VITALS — BP 133/87 | HR 72 | Ht 64.0 in | Wt 190.2 lb

## 2022-12-01 DIAGNOSIS — G4733 Obstructive sleep apnea (adult) (pediatric): Secondary | ICD-10-CM

## 2022-12-01 DIAGNOSIS — R419 Unspecified symptoms and signs involving cognitive functions and awareness: Secondary | ICD-10-CM

## 2022-12-01 DIAGNOSIS — F988 Other specified behavioral and emotional disorders with onset usually occurring in childhood and adolescence: Secondary | ICD-10-CM | POA: Diagnosis not present

## 2022-12-01 DIAGNOSIS — G4719 Other hypersomnia: Secondary | ICD-10-CM

## 2022-12-01 DIAGNOSIS — M542 Cervicalgia: Secondary | ICD-10-CM

## 2022-12-01 DIAGNOSIS — R413 Other amnesia: Secondary | ICD-10-CM

## 2022-12-01 DIAGNOSIS — G43009 Migraine without aura, not intractable, without status migrainosus: Secondary | ICD-10-CM

## 2022-12-01 DIAGNOSIS — G40909 Epilepsy, unspecified, not intractable, without status epilepticus: Secondary | ICD-10-CM | POA: Diagnosis not present

## 2022-12-01 NOTE — Progress Notes (Signed)
[facility]  PATIENT: Christina Baldwin DOB: [birth year]  REFERRING DOCTOR OR PCP: Christina Jumper, PA-C SOURCE: Patient, notes from primary care  _________________________________   HISTORICAL  CHIEF COMPLAINT:  Chief Complaint  Patient presents with   Follow-up    Pt in room 10. Here for Seizure/memory follow up. Pt does not want cpap, she never picked up states too expensive and states she probably won't use it daily. No recent seizures,. Pt said memory is stable, not getting any worse. Pt said vision worsen, blurry vision, has eye appointment in July. MOCA:29    HISTORY OF PRESENT ILLNESS:   Christina Baldwin is a 47 y.o. woman with cognitive symptoms and history of seizures  Update 12/01/2022: Since the last visit she had a 72 hour ambulatory EEG showing: "This is a normal 72 hours ambulatory video EEG. There were no seizures or epileptiform discharges seen during this recording. Normal EEG, however does not rule out epilepsy. "  She has had no further spells since the EEG.   She reports a h/o seizures.   She last had a GTC seizure around 2008.    She sometimes has episodes of altered awareness and staring lasting a few seconds  She had an EEG (05/09/2021 normal ).    72 hr video EEG was negative.  She notes some headaches located temporal and retro-orbital.   She sometimes has nausea.  She has photophobia and phonophobia.   She takes prn Robaxin, sumatriptan for migraine headaches.  Topiramate was not tolerated.   She has never tried an anti-CGRP agent for treatment or prophylaxis.   She is getting cervical ES for neck and shoulder pain.  These help for a few weeks.  Due to more symptoms in her legs she had a NCV study/EMG but I don't have results  She has noted worsening vision right  worse than left.   She is noting reading road signs.  She feels her glasses no longer help as much as they used.   She notes some difficulty with short term memory. This is  stable compared to her last visit with me.   MoCA was 29 (normal) today.     She sleeps about 7-8 hours most nights.    Adderall had helped the mental fog.   She works in [employer] and notes some decreased processing but is still doing her job.    She has some insomnia and takes trazodone prn.      She snores.  HST showed mild overall OSA (pAHI = 7.7) and moderate REM related OSA (REM AHI = 17/h)  She denies EDS.    She never did CPAP  She denies depression.        EPWORTH SLEEPINESS SCALE  On a scale of 0 - 3 what is the chance of dozing:  Sitting and Reading:   2 Watching TV:    3 Sitting inactive in a public place: 2 Passenger in car for one hour: 3 Lying down to rest in the afternoon: 3 Sitting and talking to someone: 0 Sitting quietly after lunch:  2 In a car, stopped in traffic:  0  Total (out of 24):    15/24   moderate EDS       12/01/2022    9:14 AM 02/03/2022    1:14 PM 04/07/2021   10:23 AM  Montreal Cognitive Assessment   Visuospatial/ Executive (0/5) 5 5 5   Naming (0/3) 3 3 3   Attention: Read list of digits (0/2)  2 2 2   Attention: Read list of letters (0/1) 1 1 1   Attention: Serial 7 subtraction starting at 100 (0/3) 3 3 3   Language: Repeat phrase (0/2) 2 2 0  Language : Fluency (0/1) 1 1 1   Abstraction (0/2) 2 2 2   Delayed Recall (0/5) 4 4 4   Orientation (0/6) 6 5 6   Total 29 28 27      DATA: EEG 04/22/2021 was normal.    Home sleep study 05/06/2021 showed mild OSA (7.7).  Moderate during REM sleep (16.7).     TSH was normal.  History of mental status changes She began to note her thinking was more foggy in late 2021.  Specifically, she noted some short term memory issues.  She sometimes repeated herself.   She noted that she repeats words or switches letters while typing.   She sometimes makes mistakes on similar words (substituting scents for sense as example).  She makes other language errors.    She feels information processing is reduced.   .  She denies  difficulty with gait, balance, coordination, strength or sensation.   Vision is a little worse but felt to be due to correction.      Other neurologic: She has a h/o seizures.   She had a GTC seizure at age 47 and 3 or 4 more over the next few years.   She also had some petit mal seizures.   She had several EEG studies (last one 2000-2001) that were abnormal.   She was on phenobarbital and dilantin.  She had increases petit mal seizures with Dilantin and Tegretol She still wonders if she has petit mal seizures.           12/01/2022    9:14 AM 02/03/2022    1:14 PM 04/07/2021   10:23 AM  Montreal Cognitive Assessment   Visuospatial/ Executive (0/5) 5 5 5   Naming (0/3) 3 3 3   Attention: Read list of digits (0/2) 2 2 2   Attention: Read list of letters (0/1) 1 1 1   Attention: Serial 7 subtraction starting at 100 (0/3) 3 3 3   Language: Repeat phrase (0/2) 2 2 0  Language : Fluency (0/1) 1 1 1   Abstraction (0/2) 2 2 2   Delayed Recall (0/5) 4 4 4   Orientation (0/6) 6 5 6   Total 29 28 27    TSH ws normal 10/02/2019.   B12 was normal 12/2020.    She is on metformin for NIDDM Type 2 (last HgbA1c was 5.6 03/24/2021 but was 6.5)  REVIEW OF SYSTEMS: Constitutional: No fevers, chills, sweats, or change in appetite Eyes: No visual changes, double vision, eye pain Ear, nose and throat: No hearing loss, ear pain, nasal congestion, sore throat Cardiovascular: No chest pain, palpitations Respiratory:  No shortness of breath at rest or with exertion.   No wheezes GastrointestinaI: No nausea, vomiting, diarrhea, abdominal pain, fecal incontinence Genitourinary:  No dysuria, urinary retention or frequency.  No nocturia. Musculoskeletal:  No neck pain, back pain Integumentary: No rash, pruritus, skin lesions Neurological: as above Psychiatric: No depression at this time.  No anxiety Endocrine: No palpitations, diaphoresis, change in appetite, change in weigh or increased thirst Hematologic/Lymphatic:  No  anemia, purpura, petechiae. Allergic/Immunologic: No itchy/runny eyes, nasal congestion, recent allergic reactions, rashes  ALLERGIES: Allergies  Allergen Reactions   Sulfa Antibiotics     Bactrim  Renal failure    Liquid Bandage [New Skin] Rash    dermabond allergy     HOME MEDICATIONS:  Current  Outpatient Medications:    amphetamine-dextroamphetamine (ADDERALL XR) 20 MG 24 hr capsule, Take 1 capsule (20 mg total) by mouth daily., Disp: 90 capsule, Rfl: 0   carvedilol (COREG) 12.5 MG tablet, Take 6.25 mg by mouth 2 (two) times daily., Disp: , Rfl:    Cholecalciferol (VITAMIN D3) 1.25 MG (50000 UT) CAPS, Take 1 capsule by mouth once a week., Disp: , Rfl:    hydrochlorothiazide (HYDRODIURIL) 25 MG tablet, Take 25 mg by mouth daily., Disp: , Rfl:    metFORMIN (GLUCOPHAGE) 500 MG tablet, Take 500 mg by mouth daily., Disp: , Rfl:    methocarbamol (ROBAXIN) 750 MG tablet, Take 750 mg by mouth daily as needed., Disp: , Rfl:    rosuvastatin (CRESTOR) 20 MG tablet, Take 20 mg by mouth daily., Disp: , Rfl:    SUMAtriptan (IMITREX) 50 MG tablet, Take 50 mg by mouth every 2 (two) hours as needed for migraine. May repeat in 2 hours if headache persists or recurs., Disp: , Rfl:    telmisartan (MICARDIS) 80 MG tablet, Take 80 mg by mouth daily., Disp: , Rfl:    traZODone (DESYREL) 50 MG tablet, Take 50 mg by mouth at bedtime., Disp: , Rfl:    tirzepatide (MOUNJARO) 12.5 MG/0.5ML Pen, Inject 12.5 mg into the skin once a week., Disp: , Rfl:   PAST MEDICAL HISTORY: Past Medical History:  Diagnosis Date   ADD (attention deficit disorder)    Cancer (HCC)    Family history of breast cancer    Family history of cervical cancer    Family history of lung cancer    Hypertension    Mixed hyperlipidemia    Pre-diabetes     PAST SURGICAL HISTORY: Past Surgical History:  Procedure Laterality Date   APPLICATION OF A-CELL OF CHEST/ABDOMEN Right 05/19/2020   Procedure: ACELLULAR DERMIS TO RIGHT CHEST;   Surgeon: Glenna Fellows, MD;  Location: [facility];  Service: Plastics;  Laterality: Right;   BREAST IMPLANT EXCHANGE Right 05/19/2020   Procedure: REVISION RIGHT BREAST RECONSTRUCTION WITH SILICONE IMPLANT EXCHANGE;  Surgeon: Glenna Fellows, MD;  Location: [facility];  Service: Plastics;  Laterality: Right;   BREAST LUMPECTOMY WITH RADIOACTIVE SEED LOCALIZATION Right 06/20/2019   Procedure: RIGHT BREAST LUMPECTOMY x2 WITH RADIOACTIVE SEED LOCALIZATION;  Surgeon: Harriette Bouillon, MD;  Location: [facility];  Service: General;  Laterality: Right;   BREAST RECONSTRUCTION WITH PLACEMENT OF TISSUE EXPANDER AND ALLODERM Bilateral 11/27/2019   Procedure: BILATERAL BREAST RECONSTRUCTION WITH PLACEMENT OF TISSUE EXPANDER AND ALLODERM;  Surgeon: Glenna Fellows, MD;  Location: [facility];  Service: Plastics;  Laterality: Bilateral;   BREAST RECONSTRUCTION WITH PLACEMENT OF TISSUE EXPANDER AND ALLODERM Right 01/29/2020   Procedure: RIGHT BREAST RECONSTRUCTION WITH PLACEMENT OF TISSUE EXPANDER AND ALLODERM;  Surgeon: Glenna Fellows, MD;  Location: [facility];  Service: Plastics;  Laterality: Right;   BREAST SURGERY     ENDOMETRIAL ABLATION     HAND SURGERY     LIPOSUCTION WITH LIPOFILLING Bilateral 05/19/2020   Procedure: LIPOFILLING FROM ABDOMEN TO BILATERAL CHEST;  Surgeon: Glenna Fellows, MD;  Location: [facility];  Service: Plastics;  Laterality: Bilateral;   REMOVAL OF TISSUE EXPANDER AND PLACEMENT OF IMPLANT Bilateral 02/22/2020   Procedure: REMOVAL OF BILATERAL TISSUE EXPANDERS AND PLACEMENT OF SILICONE IMPLANTS;  Surgeon: Glenna Fellows, MD;  Location: [facility];  Service: Plastics;  Laterality: Bilateral;   SIMPLE MASTECTOMY WITH AXILLARY SENTINEL NODE BIOPSY Bilateral 11/27/2019  Procedure: BILATERAL SIMPLE MASTECTOMY;  Surgeon: Harriette Bouillon, MD;  Location: [facility];   Service: General;  Laterality: Bilateral;   TISSUE EXPANDER PLACEMENT Bilateral 12/14/2019   Procedure: REMOVAL RIGHT CHEST TISSUE EXPANDER, TISSUE EXPANSION LEFT CHEST;  Surgeon: Glenna Fellows, MD;  Location: [facility];  Service: Plastics;  Laterality: Bilateral;   TUMOR REMOVAL      FAMILY HISTORY: Family History  Problem Relation Age of Onset   Lupus Mother    Hypertension Father    High Cholesterol Father    Autoimmune disease Brother    Breast cancer Maternal Aunt        dx early 30s   COPD Maternal Grandmother    Dementia Maternal Grandmother    Heart failure Maternal Grandmother    Leukemia Maternal Grandfather    Lung cancer Paternal Grandmother    Cervical cancer Cousin    Lung cancer Cousin    Breast cancer Cousin        dx 23s   Breast cancer Maternal Great-grandmother    Breast cancer Other    Seizures Neg Hx     SOCIAL HISTORY:  Social History   Socioeconomic History   Marital status: Single    Spouse name: Not on file   Number of children: Not on file   Years of education: Not on file   Highest education level: Master's degree (e.g., MA, MS, MEng, MEd, MSW, MBA)  Occupational History   Not on file  Tobacco Use   Smoking status: Former    Types: Cigarettes    Quit date: 2017    Years since quitting: 7.3   Smokeless tobacco: Never  Vaping Use   Vaping Use: Never used  Substance and Sexual Activity   Alcohol use: Yes    Comment: occas   Drug use: Never   Sexual activity: Not on file  Other Topics Concern   Not on file  Social History Narrative   Not on file   Social Determinants of Health   Financial Resource Strain: Not on file  Food Insecurity: Not on file  Transportation Needs: Not on file  Physical Activity: Not on file  Stress: Not on file  Social Connections: Not on file  Intimate Partner Violence: Not on file     PHYSICAL EXAM  Vitals:   12/01/22 0909  BP: 133/87  Pulse: 72  Weight: 190 lb 3.2 oz (86.3  kg)  Height: 5\' 4"  (1.626 m)    Body mass index is 32.65 kg/m.  VA without correction: OS 20/30 OD 20/40 Colors are symmetric  General: The patient is well-developed and well-nourished and in no acute distress  HEENT/Neck:  Head is [place]/AT.  Sclera are anicteric.    Mildly reduced neck ROM, tender over right >>left occiput.  Passive lifting head reduces the pain  Skin/Ext: Extremities are without rash or edema.  Neurologic Exam  Mental status: The patient is alert and oriented x 3 at the time of the examination. The patient has apparent normal recent and remote memory, with an apparently normal attention span and concentration ability.   Speech is normal.     Cranial nerves: Extraocular movements are full.  Facial strength and sensation was normal.  No obvious hearing deficits are noted.  Motor:  Muscle bulk is normal.   Tone is normal. Strength is  5 / 5 in all 4 extremities.   Sensory: Sensory testing is intact to pinprick, soft touch and vibration sensation in all 4 extremities.  Coordination: Cerebellar testing reveals good finger-nose-finger and heel-to-shin bilaterally.  Gait and station: Station is normal.   Gait and tandem gait are normal.  Romberg is negative.  Reflexes: Deep tendon reflexes are symmetric and normal bilaterally.      DIAGNOSTIC DATA (LABS, IMAGING, TESTING) - I reviewed patient records, labs, notes, testing and imaging myself where available.  Lab Results  Component Value Date   WBC 9.5 11/22/2019   HGB 12.1 11/22/2019   HCT 38.5 11/22/2019   MCV 93.4 11/22/2019   PLT 310 11/22/2019      Component Value Date/Time   NA 135 02/21/2020 1200   K 3.7 02/21/2020 1200   CL 100 02/21/2020 1200   CO2 24 02/21/2020 1200   GLUCOSE 138 (H) 02/21/2020 1200   BUN 15 02/21/2020 1200   CREATININE 0.84 02/21/2020 1200   CALCIUM 9.6 02/21/2020 1200   PROT 6.7 11/22/2019 0928   ALBUMIN 4.0 11/22/2019 0928   AST 17 11/22/2019 0928   ALT 20 11/22/2019  0928   ALKPHOS 26 (L) 11/22/2019 0928   BILITOT 0.4 11/22/2019 0928   GFRNONAA >60 02/21/2020 1200   GFRAA >60 02/21/2020 1200       ASSESSMENT AND PLAN  Seizure disorder (HCC)  OSA (obstructive sleep apnea)  Memory loss  Attention deficit disorder (ADD) in adult  Excessive daytime sleepiness  Cognitive complaints  Migraine without aura and without status migrainosus, not intractable   1.   Continue Adderall XR 20 mg qAM for OSA and ADD 2.   She had mild OSA, She prefers not to use CPAP.  We discussed weight loss as 30+ pound weight loss would likely get her to minimal/negligible OSA.   3.  Trial Ubrelvy 100 mg x 2 .   Lot [id number]; Exp 03/2024.   If these help she will let us know and we will send in a script 4.   Right splenius capitus TPI with 80 mg Depo-Medrol in 3 cc marcaine using sterile technique.  She tolerated the injection well and HA was better afterwards.   5.  Return in 6 months or sooner if there are new or worsening neurologic symptoms.   42-minute office visit with the majority of the time spent face-to-face for history and physical, discussion/counseling and decision-making.  Additional time with record review and documentation.  Imo Cumbie A. Epimenio Foot, MD, [facility] 12/01/2022, 10:06 AM Certified in Neurology, Clinical Neurophysiology, Sleep Medicine and Neuroimaging  [facility] [address], [address] [place], [place] [address] [phone]

## 2022-12-01 NOTE — Telephone Encounter (Signed)
[employer] sent to GI they obtain auth [id number] 

## 2022-12-16 ENCOUNTER — Ambulatory Visit: Payer: Managed Care, Other (non HMO) | Admitting: Neurology

## 2022-12-28 ENCOUNTER — Other Ambulatory Visit: Payer: Self-pay | Admitting: Pain Medicine

## 2022-12-28 ENCOUNTER — Encounter: Payer: Self-pay | Admitting: Pain Medicine

## 2022-12-28 DIAGNOSIS — M7551 Bursitis of right shoulder: Secondary | ICD-10-CM

## 2022-12-28 DIAGNOSIS — G8929 Other chronic pain: Secondary | ICD-10-CM

## 2022-12-28 DIAGNOSIS — M25511 Pain in right shoulder: Secondary | ICD-10-CM

## 2022-12-31 ENCOUNTER — Ambulatory Visit
Admission: RE | Admit: 2022-12-31 | Discharge: 2022-12-31 | Disposition: A | Discharge status: Home / Self Care | Payer: Managed Care, Other (non HMO) | Source: Ambulatory Visit | Attending: Neurology | Admitting: Neurology

## 2022-12-31 ENCOUNTER — Ambulatory Visit
Admission: RE | Admit: 2022-12-31 | Discharge: 2022-12-31 | Disposition: A | Discharge status: Home / Self Care | Payer: Managed Care, Other (non HMO) | Source: Ambulatory Visit | Attending: Pain Medicine | Admitting: Pain Medicine

## 2022-12-31 DIAGNOSIS — G43009 Migraine without aura, not intractable, without status migrainosus: Secondary | ICD-10-CM | POA: Diagnosis not present

## 2022-12-31 DIAGNOSIS — M25511 Pain in right shoulder: Secondary | ICD-10-CM

## 2022-12-31 DIAGNOSIS — M7551 Bursitis of right shoulder: Secondary | ICD-10-CM

## 2022-12-31 DIAGNOSIS — G8929 Other chronic pain: Secondary | ICD-10-CM

## 2022-12-31 DIAGNOSIS — G40909 Epilepsy, unspecified, not intractable, without status epilepticus: Secondary | ICD-10-CM

## 2022-12-31 MED ORDER — GADOPICLENOL 0.5 MMOL/ML IV SOLN
7.5000 mL | Freq: Once | INTRAVENOUS | Status: AC | PRN
  Administered 2022-12-31: 7.5 mL via INTRAVENOUS

## 2023-02-24 ENCOUNTER — Other Ambulatory Visit: Payer: Self-pay | Admitting: Neurology

## 2023-03-01 MED ORDER — AMPHETAMINE-DEXTROAMPHET ER 20 MG PO CP24: 20.0000 mg | ORAL_CAPSULE | Freq: Every day | ORAL | 0 refills | Status: DC

## 2023-03-01 NOTE — Telephone Encounter (Signed)
Dr.Sater patient is asking increase dose, patient is taking 20 mg dose per patient  "The current dose does not seem to be working as well. It seems to be less effective and also does not seem to last during the day."   Last seen on 12/01/22 Follow up scheduled on 06/07/23 Last filled on 11/16/22 #90 tablets (90 day supply)

## 2023-06-07 ENCOUNTER — Ambulatory Visit: Payer: Managed Care, Other (non HMO) | Admitting: Neurology

## 2023-06-07 ENCOUNTER — Encounter: Payer: Self-pay | Admitting: Neurology

## 2023-06-07 VITALS — BP 143/93 | HR 67 | Ht 64.0 in | Wt 211.5 lb

## 2023-06-07 DIAGNOSIS — R5383 Other fatigue: Secondary | ICD-10-CM

## 2023-06-07 DIAGNOSIS — G4733 Obstructive sleep apnea (adult) (pediatric): Secondary | ICD-10-CM | POA: Diagnosis not present

## 2023-06-07 DIAGNOSIS — G43909 Migraine, unspecified, not intractable, without status migrainosus: Secondary | ICD-10-CM

## 2023-06-07 DIAGNOSIS — R419 Unspecified symptoms and signs involving cognitive functions and awareness: Secondary | ICD-10-CM | POA: Diagnosis not present

## 2023-06-07 DIAGNOSIS — R413 Other amnesia: Secondary | ICD-10-CM | POA: Diagnosis not present

## 2023-06-07 DIAGNOSIS — M2550 Pain in unspecified joint: Secondary | ICD-10-CM

## 2023-06-07 MED ORDER — UBRELVY 100 MG PO TABS: ORAL_TABLET | ORAL | 11 refills | Status: DC

## 2023-06-07 NOTE — Progress Notes (Signed)
[facility]  PATIENT: Christina Baldwin DOB: 09-Aug-1975  REFERRING DOCTOR OR PCP: Romie Jumper, PA-C SOURCE: Patient, notes from primary care  _________________________________   HISTORICAL  CHIEF COMPLAINT:  Chief Complaint  Patient presents with   Room 10    Pt is here Alone. Pt states that she would like to switch to the [place]. Pt states that she is having tingling and burning sensation in her arms. Pt states that she has been having some tingling and burning in her feet as well. Pt states that she has noticed some weight gain.     HISTORY OF PRESENT ILLNESS:   Christina Baldwin is a 47 y.o. woman with cognitive symptoms and history of seizures  Update 06/07/2023: HA's improved a lot after the splenius capitus injections.  She has 12 HA and 3 migraines a month.   When a migraine occurs, Bernita Raisin helps a lot.   It helps better than sumatriptan did.  She notes some headaches located temporal and retro-orbital.   She sometimes has nausea.  She has photophobia and phonophobia.   Topiramate was not tolerated.  Bernita Raisin helps more han sumatriptan.    She is noting swelling in her joints.  Couple days ago, BP was 169/110 and she took a clonidine .   While this was going on, both of her arms were tingly. Next day,  BP reduced to 130/80 but HR was 40's.  She felt off that day.    Today 143/93.    She has gained weight and feels more tired.     She has no more spells but notes many symptoms still.  She reports a h/o seizures.   She last had a GTC seizure around 2008.    She sometimes has episodes of altered awareness and staring lasting a few seconds  She had an EEG (05/09/2021 normal ).    72 hr video EEG was negative.  She notes some difficulty with short term memory. This is stable compared to her last visit with me.   MoCA was 29 (normal) today.    Vision unchanged.      She sleeps about 7-8 hours most nights.    Adderall had helped the mental fog.   She  works in [employer] and notes some decreased processing but is still doing her job.    She has some insomnia and takes trazodone prn.      She snores.  HST showed mild overall OSA (pAHI = 7.7) and moderate REM related OSA (REM AHI = 17/h)  She denies EDS.    She never did CPAP.     She denies depression.        EPWORTH SLEEPINESS SCALE  On a scale of 0 - 3 what is the chance of dozing:  Sitting and Reading:   2 Watching TV:    3 Sitting inactive in a public place: 2 Passenger in car for one hour: 3 Lying down to rest in the afternoon: 3 Sitting and talking to someone: 0 Sitting quietly after lunch:  2 In a car, stopped in traffic:  0  Total (out of 24):    15/24   moderate EDS       12/01/2022    9:14 AM 02/03/2022    1:14 PM 04/07/2021   10:23 AM  Montreal Cognitive Assessment   Visuospatial/ Executive (0/5) 5 5 5   Naming (0/3) 3 3 3   Attention: Read list of digits (0/2) 2 2 2   Attention: Read list  of letters (0/1) 1 1 1   Attention: Serial 7 subtraction starting at 100 (0/3) 3 3 3   Language: Repeat phrase (0/2) 2 2 0  Language : Fluency (0/1) 1 1 1   Abstraction (0/2) 2 2 2   Delayed Recall (0/5) 4 4 4   Orientation (0/6) 6 5 6   Total 29 28 27      DATA: EEG 04/22/2021 was normal.    Home sleep study 05/06/2021 showed mild OSA (7.7).  Moderate during REM sleep (16.7).     TSH was normal.  History of mental status changes She began to note her thinking was more foggy in late 2021.  Specifically, she noted some short term memory issues.  She sometimes repeated herself.   She noted that she repeats words or switches letters while typing.   She sometimes makes mistakes on similar words (substituting scents for sense as example).  She makes other language errors.    She feels information processing is reduced.   .  She denies difficulty with gait, balance, coordination, strength or sensation.   Vision is a little worse but felt to be due to correction.      Other neurologic: She has  a h/o seizures.   She had a GTC seizure at age 75 and 3 or 4 more over the next few years.   She also had some petit mal seizures.   She had several EEG studies (last one 2000-2001) that were abnormal.   She was on phenobarbital and dilantin.  She had increases petit mal seizures with Dilantin and Tegretol She still wonders if she has petit mal seizures.           12/01/2022    9:14 AM 02/03/2022    1:14 PM 04/07/2021   10:23 AM  Montreal Cognitive Assessment   Visuospatial/ Executive (0/5) 5 5 5   Naming (0/3) 3 3 3   Attention: Read list of digits (0/2) 2 2 2   Attention: Read list of letters (0/1) 1 1 1   Attention: Serial 7 subtraction starting at 100 (0/3) 3 3 3   Language: Repeat phrase (0/2) 2 2 0  Language : Fluency (0/1) 1 1 1   Abstraction (0/2) 2 2 2   Delayed Recall (0/5) 4 4 4   Orientation (0/6) 6 5 6   Total 29 28 27    TSH ws normal 10/02/2019.   B12 was normal 12/2020.    She is on metformin for NIDDM Type 2 (last HgbA1c was 5.6 03/24/2021 but was 6.5)  REVIEW OF SYSTEMS: Constitutional: No fevers, chills, sweats, or change in appetite Eyes: No visual changes, double vision, eye pain Ear, nose and throat: No hearing loss, ear pain, nasal congestion, sore throat Cardiovascular: No chest pain, palpitations Respiratory:  No shortness of breath at rest or with exertion.   No wheezes GastrointestinaI: No nausea, vomiting, diarrhea, abdominal pain, fecal incontinence Genitourinary:  No dysuria, urinary retention or frequency.  No nocturia. Musculoskeletal:  No neck pain, back pain Integumentary: No rash, pruritus, skin lesions Neurological: as above Psychiatric: No depression at this time.  No anxiety Endocrine: No palpitations, diaphoresis, change in appetite, change in weigh or increased thirst Hematologic/Lymphatic:  No anemia, purpura, petechiae. Allergic/Immunologic: No itchy/runny eyes, nasal congestion, recent allergic reactions, rashes  ALLERGIES: Allergies  Allergen  Reactions   Sulfa Antibiotics     Bactrim  Renal failure    Liquid Bandage [New Skin] Rash    dermabond allergy     HOME MEDICATIONS:  Current Outpatient Medications:    amphetamine-dextroamphetamine (ADDERALL  XR) 20 MG 24 hr capsule, Take 1 capsule (20 mg total) by mouth daily., Disp: 90 capsule, Rfl: 0   carvedilol (COREG) 12.5 MG tablet, Take 6.25 mg by mouth 2 (two) times daily., Disp: , Rfl:    Cholecalciferol (VITAMIN D3) 1.25 MG (50000 UT) CAPS, Take 1 capsule by mouth once a week., Disp: , Rfl:    hydrochlorothiazide (HYDRODIURIL) 25 MG tablet, Take 25 mg by mouth daily., Disp: , Rfl:    metFORMIN (GLUCOPHAGE) 500 MG tablet, Take 500 mg by mouth daily., Disp: , Rfl:    methocarbamol (ROBAXIN) 750 MG tablet, Take 750 mg by mouth daily as needed., Disp: , Rfl:    rosuvastatin (CRESTOR) 20 MG tablet, Take 20 mg by mouth daily., Disp: , Rfl:    SUMAtriptan (IMITREX) 50 MG tablet, Take 50 mg by mouth every 2 (two) hours as needed for migraine. May repeat in 2 hours if headache persists or recurs., Disp: , Rfl:    telmisartan (MICARDIS) 80 MG tablet, Take 80 mg by mouth daily., Disp: , Rfl:    traZODone (DESYREL) 50 MG tablet, Take 50 mg by mouth at bedtime., Disp: , Rfl:   PAST MEDICAL HISTORY: Past Medical History:  Diagnosis Date   ADD (attention deficit disorder)    Cancer (HCC)    Family history of breast cancer    Family history of cervical cancer    Family history of lung cancer    Hypertension    Mixed hyperlipidemia    Pre-diabetes     PAST SURGICAL HISTORY: Past Surgical History:  Procedure Laterality Date   APPLICATION OF A-CELL OF CHEST/ABDOMEN Right 05/19/2020   Procedure: ACELLULAR DERMIS TO RIGHT CHEST;  Surgeon: Glenna Fellows, MD;  Location: [facility];  Service: Plastics;  Laterality: Right;   BREAST IMPLANT EXCHANGE Right 05/19/2020   Procedure: REVISION RIGHT BREAST RECONSTRUCTION WITH SILICONE IMPLANT EXCHANGE;  Surgeon: Glenna Fellows, MD;  Location: [facility];  Service: Plastics;  Laterality: Right;   BREAST LUMPECTOMY WITH RADIOACTIVE SEED LOCALIZATION Right 06/20/2019   Procedure: RIGHT BREAST LUMPECTOMY x2 WITH RADIOACTIVE SEED LOCALIZATION;  Surgeon: Harriette Bouillon, MD;  Location: [facility];  Service: General;  Laterality: Right;   BREAST RECONSTRUCTION WITH PLACEMENT OF TISSUE EXPANDER AND ALLODERM Bilateral 11/27/2019   Procedure: BILATERAL BREAST RECONSTRUCTION WITH PLACEMENT OF TISSUE EXPANDER AND ALLODERM;  Surgeon: Glenna Fellows, MD;  Location: [facility];  Service: Plastics;  Laterality: Bilateral;   BREAST RECONSTRUCTION WITH PLACEMENT OF TISSUE EXPANDER AND ALLODERM Right 01/29/2020   Procedure: RIGHT BREAST RECONSTRUCTION WITH PLACEMENT OF TISSUE EXPANDER AND ALLODERM;  Surgeon: Glenna Fellows, MD;  Location: [facility];  Service: Plastics;  Laterality: Right;   BREAST SURGERY     ENDOMETRIAL ABLATION     HAND SURGERY     LIPOSUCTION WITH LIPOFILLING Bilateral 05/19/2020   Procedure: LIPOFILLING FROM ABDOMEN TO BILATERAL CHEST;  Surgeon: Glenna Fellows, MD;  Location: [facility];  Service: Plastics;  Laterality: Bilateral;   REMOVAL OF TISSUE EXPANDER AND PLACEMENT OF IMPLANT Bilateral 02/22/2020   Procedure: REMOVAL OF BILATERAL TISSUE EXPANDERS AND PLACEMENT OF SILICONE IMPLANTS;  Surgeon: Glenna Fellows, MD;  Location: [facility];  Service: Plastics;  Laterality: Bilateral;   SIMPLE MASTECTOMY WITH AXILLARY SENTINEL NODE BIOPSY Bilateral 11/27/2019   Procedure: BILATERAL SIMPLE MASTECTOMY;  Surgeon: Harriette Bouillon, MD;  Location: [facility];  Service: General;  Laterality: Bilateral;   TISSUE EXPANDER PLACEMENT Bilateral  12/14/2019   Procedure: REMOVAL RIGHT CHEST TISSUE EXPANDER, TISSUE EXPANSION LEFT CHEST;  Surgeon: Glenna Fellows, MD;  Location: [facility];  Service:  Plastics;  Laterality: Bilateral;   TUMOR REMOVAL      FAMILY HISTORY: Family History  Problem Relation Age of Onset   Lupus Mother    Hypertension Father    High Cholesterol Father    Autoimmune disease Brother    Breast cancer Maternal Aunt        dx early 69s   COPD Maternal Grandmother    Dementia Maternal Grandmother    Heart failure Maternal Grandmother    Leukemia Maternal Grandfather    Lung cancer Paternal Grandmother    Cervical cancer Cousin    Lung cancer Cousin    Breast cancer Cousin        dx 30s   Breast cancer Maternal Great-grandmother    Breast cancer Other    Seizures Neg Hx     SOCIAL HISTORY:  Social History   Socioeconomic History   Marital status: Single    Spouse name: Not on file   Number of children: Not on file   Years of education: Not on file   Highest education level: Master's degree (e.g., MA, MS, MEng, MEd, MSW, MBA)  Occupational History   Not on file  Tobacco Use   Smoking status: Former    Current packs/day: 0.00    Types: Cigarettes    Quit date: 2017    Years since quitting: 7.8   Smokeless tobacco: Never  Vaping Use   Vaping status: Never Used  Substance and Sexual Activity   Alcohol use: Yes    Comment: occas   Drug use: Never   Sexual activity: Not on file  Other Topics Concern   Not on file  Social History Narrative   Not on file   Social Determinants of Health   Financial Resource Strain: Low Risk  (12/21/2022)   Received from [facility], [facility]   Overall Financial Resource Strain (CARDIA)    Difficulty of Paying Living Expenses: Not hard at all  Food Insecurity: No Food Insecurity (12/21/2022)   Received from [facility], [facility]   Hunger Vital Sign    Worried About Running Out of Food in the Last Year: Never true    Ran Out of Food in the Last Year: Never true  Transportation Needs: No Transportation Needs (12/21/2022)   Received from [facility], [facility]   PRAPARE - Transportation     Lack of Transportation (Medical): No    Lack of Transportation (Non-Medical): No  Physical Activity: Sufficiently Active (12/21/2022)   Received from [facility], [facility]   Exercise Vital Sign    Days of Exercise per Week: 3 days    Minutes of Exercise per Session: 90 min  Stress: No Stress Concern Present (12/21/2022)   Received from [facility], [facility] - Occupational Stress Questionnaire    Feeling of Stress : Only a little  Social Connections: Moderately Integrated (12/21/2022)   Received from [facility], [facility]   Social Network    How would you rate your social network (family, work, friends)?: Adequate participation with social networks  Intimate Partner Violence: Not At Risk (12/21/2022)   Received from [facility], [facility]   HITS    Over the last 12 months how often did your partner physically hurt you?: Never    Over the last 12  months how often did your partner insult you or talk down to you?: Never    Over the last 12 months how often did your partner threaten you with physical harm?: Never    Over the last 12 months how often did your partner scream or curse at you?: Never     PHYSICAL EXAM  Vitals:   06/07/23 0942  BP: (!) 143/93  Pulse: 67  Weight: 211 lb 8 oz (95.9 kg)  Height: 5\' 4"  (1.626 m)    Body mass index is 36.3 kg/m.  VA without correction: OS 20/30 OD 20/40 Colors are symmetric  General: The patient is well-developed and well-nourished and in no acute distress  HEENT/Neck:  Head is [place]/AT.  Sclera are anicteric.    Mildly reduced neck ROM, tender over right >>left occiput.  Passive lifting head reduces the pain  Skin/Ext: Extremities are without rash or edema.  Neurologic Exam  Mental status: The patient is alert and oriented x 3 at the time of the examination. The patient has apparent normal recent and remote memory, with an apparently normal attention span and  concentration ability.   Speech is normal.     Cranial nerves: Extraocular movements are full.  Facial strength and sensation was normal.  No obvious hearing deficits are noted.  Motor:  Muscle bulk is normal.   Tone is normal. Strength is  5 / 5 in all 4 extremities.   Sensory: Sensory testing is intact to pinprick, soft touch and vibration sensation in all 4 extremities.     Coordination: Cerebellar testing reveals good finger-nose-finger and heel-to-shin bilaterally.  Gait and station: Station is normal.   Gait and tandem gait are normal.  Romberg is negative.  Reflexes: Deep tendon reflexes are symmetric and normal bilaterally.      DIAGNOSTIC DATA (LABS, IMAGING, TESTING) - I reviewed patient records, labs, notes, testing and imaging myself where available.  Lab Results  Component Value Date   WBC 9.5 11/22/2019   HGB 12.1 11/22/2019   HCT 38.5 11/22/2019   MCV 93.4 11/22/2019   PLT 310 11/22/2019      Component Value Date/Time   NA 135 02/21/2020 1200   K 3.7 02/21/2020 1200   CL 100 02/21/2020 1200   CO2 24 02/21/2020 1200   GLUCOSE 138 (H) 02/21/2020 1200   BUN 15 02/21/2020 1200   CREATININE 0.84 02/21/2020 1200   CALCIUM 9.6 02/21/2020 1200   PROT 6.7 11/22/2019 0928   ALBUMIN 4.0 11/22/2019 0928   AST 17 11/22/2019 0928   ALT 20 11/22/2019 0928   ALKPHOS 26 (L) 11/22/2019 0928   BILITOT 0.4 11/22/2019 0928   GFRNONAA >60 02/21/2020 1200   GFRAA >60 02/21/2020 1200       ASSESSMENT AND PLAN  Cognitive complaints  Episodic migraine  OSA (obstructive sleep apnea)  Memory loss  Multiple joint pain - Plan: ANA+ENA+DNA/DS+Scl 70+SjoSSA/B, Sedimentation rate, C-reactive protein, Rheumatoid factor   1.   Continue Adderall XR 20 mg qAM for OSA and ADD 2.   She had mild OSA, She prefers not to use CPAP.  We discussed weight loss as 30+ pound weight loss would likely get her to minimal/negligible OSA.   3.  Ubrelvy 100 mg renew (works better than  sumatriptan)_.  Consider an anti-CGRP injection if worsens 4.    Check labs for joint swelling 5.  Return in 6 months or sooner if there are new or worsening neurologic symptoms.     Orma Cheetham A. Javayah Magaw,  MD, [facility] 06/07/2023, 10:28 AM Certified in Neurology, Clinical Neurophysiology, Sleep Medicine and Neuroimaging  [facility] [address] [place], [place] [address] [phone]

## 2023-06-10 LAB — ANA+ENA+DNA/DS+SCL 70+SJOSSA/B
ANA Titer 1: NEGATIVE
ENA RNP Ab: 0.2 AI (ref 0.0–0.9)
ENA SM Ab Ser-aCnc: <0.2 AI (ref 0.0–0.9)
ENA SSA (RO) Ab: <0.2 AI (ref 0.0–0.9)
ENA SSB (LA) Ab: <0.2 AI (ref 0.0–0.9)
Scleroderma (Scl-70) (ENA) Antibody, IgG: <0.2 AI (ref 0.0–0.9)
dsDNA Ab: <1 [IU]/mL (ref 0–9)

## 2023-06-10 LAB — C-REACTIVE PROTEIN: CRP: 2 mg/L (ref 0–10)

## 2023-06-10 LAB — THYROID PANEL WITH TSH
Free Thyroxine Index: 1.7 (ref 1.2–4.9)
T3 Uptake Ratio: 23 % — ABNORMAL LOW (ref 24–39)
T4, Total: 7.3 ug/dL (ref 4.5–12.0)
TSH: 2.6 u[IU]/mL (ref 0.450–4.500)

## 2023-06-10 LAB — RHEUMATOID FACTOR: Rheumatoid fact SerPl-aCnc: 11 [IU]/mL (ref <14.0)

## 2023-06-10 LAB — SEDIMENTATION RATE: Sed Rate: 2 mm/h (ref 0–32)

## 2023-06-10 LAB — VITAMIN B12: Vitamin B-12: 450 pg/mL (ref 232–1245)

## 2023-06-21 ENCOUNTER — Other Ambulatory Visit (HOSPITAL_COMMUNITY): Payer: Self-pay

## 2023-06-21 ENCOUNTER — Telehealth: Payer: Self-pay

## 2023-06-21 NOTE — Telephone Encounter (Signed)
Pharmacy Patient Advocate Encounter  Received notification from [facility] that Prior Authorization for Ubrelvy 100MG  tablets has been APPROVED from 06/21/2023 to 06/19/2024. Ran test claim, Copay is $0. This test claim was processed through [facility]- copay amounts may vary at other pharmacies due to pharmacy/plan contracts, or as the patient moves through the different stages of their insurance plan.   PA #/Case ID/Reference #: PA Case ID #: [id number]  Key: [id number]

## 2023-06-22 ENCOUNTER — Other Ambulatory Visit: Payer: Self-pay | Admitting: Neurology

## 2023-06-22 MED ORDER — AMPHETAMINE-DEXTROAMPHET ER 20 MG PO CP24: 20.0000 mg | ORAL_CAPSULE | Freq: Every day | ORAL | 0 refills | Status: DC

## 2023-06-22 NOTE — Telephone Encounter (Signed)
Last seen on 06/07/23 Follow up scheduled on 12/22/23 Last filled on 03/01/23 #90 tablets (90 day supply) Rx pending to be signed

## 2023-08-03 ENCOUNTER — Other Ambulatory Visit: Payer: Self-pay | Admitting: Neurology

## 2023-08-11 MED ORDER — AMPHETAMINE-DEXTROAMPHET ER 20 MG PO CP24: 20.0000 mg | ORAL_CAPSULE | Freq: Every day | ORAL | 0 refills | Status: DC

## 2023-12-22 ENCOUNTER — Ambulatory Visit: Payer: Managed Care, Other (non HMO) | Admitting: Neurology

## 2023-12-22 ENCOUNTER — Telehealth: Payer: Self-pay | Admitting: Neurology

## 2023-12-22 NOTE — Telephone Encounter (Signed)
 Pt cancelled appointment due to vomiting, diarrhea.

## 2023-12-22 NOTE — Telephone Encounter (Signed)
 Pt has r/s her missed appointment with wait list

## 2024-01-03 ENCOUNTER — Encounter: Payer: Self-pay | Admitting: Neurology

## 2024-01-03 DIAGNOSIS — G43009 Migraine without aura, not intractable, without status migrainosus: Secondary | ICD-10-CM

## 2024-01-03 MED ORDER — UBRELVY 100 MG PO TABS
ORAL_TABLET | ORAL | 1 refills | Status: DC
Start: 2024-01-03 — End: 2024-01-04

## 2024-01-03 NOTE — Telephone Encounter (Signed)
 Called [facility] at [phone]. Spoke w/ tech who cx any remaining refills on file. Pt only filled once 06/07/23 #12. [facility] for [facility].

## 2024-01-04 MED ORDER — UBRELVY 100 MG PO TABS: ORAL_TABLET | ORAL | 1 refills | Status: DC

## 2024-01-04 NOTE — Telephone Encounter (Signed)
 [facility] at [phone]. Spoke w/ Elle. Transferred me to dedicated team that handles pt account. Spoke w/ Anne/member services who then transferred me to provider service team. Spoke w/ [facility] [place]. They cx rx Ubrelvy  on file.  Total time of call: 16 min.  E-scribed rx Ubrelvy  to [facility] instead per pt request.

## 2024-06-06 ENCOUNTER — Ambulatory Visit: Admitting: Neurology

## 2024-06-06 ENCOUNTER — Encounter: Payer: Self-pay | Admitting: Neurology

## 2024-06-06 VITALS — BP 136/95 | HR 86 | Ht 64.0 in | Wt 203.0 lb

## 2024-06-06 DIAGNOSIS — R404 Transient alteration of awareness: Secondary | ICD-10-CM

## 2024-06-06 DIAGNOSIS — G43909 Migraine, unspecified, not intractable, without status migrainosus: Secondary | ICD-10-CM

## 2024-06-06 DIAGNOSIS — G4733 Obstructive sleep apnea (adult) (pediatric): Secondary | ICD-10-CM | POA: Diagnosis not present

## 2024-06-06 DIAGNOSIS — R419 Unspecified symptoms and signs involving cognitive functions and awareness: Secondary | ICD-10-CM | POA: Diagnosis not present

## 2024-06-06 DIAGNOSIS — F988 Other specified behavioral and emotional disorders with onset usually occurring in childhood and adolescence: Secondary | ICD-10-CM

## 2024-06-06 DIAGNOSIS — G43009 Migraine without aura, not intractable, without status migrainosus: Secondary | ICD-10-CM | POA: Diagnosis not present

## 2024-06-06 DIAGNOSIS — R413 Other amnesia: Secondary | ICD-10-CM

## 2024-06-06 MED ORDER — GUANFACINE HCL ER 1 MG PO TB24: ORAL_TABLET | ORAL | 5 refills | Status: AC

## 2024-06-06 MED ORDER — BUSPIRONE HCL 15 MG PO TABS: 15.0000 mg | ORAL_TABLET | Freq: Two times a day (BID) | ORAL | 5 refills | Status: AC

## 2024-06-06 NOTE — Progress Notes (Signed)
 [facility]  PATIENT: Christina Baldwin DOB: 07-Mar-1976  REFERRING DOCTOR OR PCP: Joesph Cedar, PA-C SOURCE: Patient, notes from primary care  _________________________________   HISTORICAL  CHIEF COMPLAINT:  Chief Complaint  Patient presents with   Follow-up    Pt in room 10. Alone.Here for cpap/migraine follow up. Pt reports blood pressure has elevated , takes Adderall, PCP and cardiology aware. Has taken Adderall due to this. Pt would like to discuss other options besides Adderral.    HISTORY OF PRESENT ILLNESS:   Nichole Neyer is a 48 y.o. woman with cognitive symptoms and history of seizures  Update 06/06/2024: She has 12 HA and 6 migraines on average on a typical month.   Migraines have Nausea, photophobia and worse with movements.  When a migraine occurs, Ubrelvy  helps a lot better than sumatriptan  did.   Topiramate was not tolerated. Beta blockers (cavedilol) have not helped.       HA's improved a lot after the splenius capitus injections in 2024 when they were daily.    ADD is better on Adderall but she has HTN and needed to stop.     She has no further spells .  She reports a h/o seizures.   She last had a GTC seizure around 2008.    She sometimes has episodes of altered awareness and staring lasting a few seconds  She had an EEG (05/09/2021 normal ).    72 hr video EEG was negative.  She denies further cognitive/memory concerns - better than last year.   MoCA was 29 (normal) recently.    Vision unchanged.      She sleeps about 7-8 hours most nights.    Adderall had helped the mental fog.   She works in [employer] and notes some decreased processing but is still doing her job.    She has some insomnia and takes trazodone  prn.      She snores.  HST showed mild overall OSA (pAHI = 7.7) and moderate REM related OSA (REM AHI = 17/h)  She denies EDS.    She never did CPAP.   She has lost 8 pounds.  She notes some anxiety but not depression.  Prozac was  poorly tolerated  EPWORTH SLEEPINESS SCALE  On a scale of 0 - 3 what is the chance of dozing:  Sitting and Reading:   2 Watching TV:    1 Sitting inactive in a public place: 0 Passenger in car for one hour: 1 Lying down to rest in the afternoon: 2 Sitting and talking to someone: 0 Sitting quietly after lunch:  1 In a car, stopped in traffic:  0  Total (out of 24):    7//24   moderate EDS       12/01/2022    9:14 AM 02/03/2022    1:14 PM 04/07/2021   10:23 AM  Montreal Cognitive Assessment   Visuospatial/ Executive (0/5) 5 5 5   Naming (0/3) 3 3 3   Attention: Read list of digits (0/2) 2 2 2   Attention: Read list of letters (0/1) 1 1 1   Attention: Serial 7 subtraction starting at 100 (0/3) 3 3 3   Language: Repeat phrase (0/2) 2 2 0  Language : Fluency (0/1) 1 1 1   Abstraction (0/2) 2 2 2   Delayed Recall (0/5) 4 4 4   Orientation (0/6) 6 5 6   Total 29 28 27      DATA: EEG 04/22/2021 was normal.     72 r EEG was normal.  Home sleep study 05/06/2021 showed mild OSA (7.7).  Moderate during REM sleep (16.7).     TSH was normal.  History of mental status changes She began to note her thinking was more foggy in late 2021.  Specifically, she noted some short term memory issues.  She sometimes repeated herself.   She noted that she repeats words or switches letters while typing.   She sometimes makes mistakes on similar words (substituting scents for sense as example).  She makes other language errors.    She feels information processing is reduced.   .  She denies difficulty with gait, balance, coordination, strength or sensation.   Vision is a little worse but felt to be due to correction.      Other neurologic: She has a h/o seizures.   She had a GTC seizure at age 38 and 3 or 4 more over the next few years.   She also had some petit mal seizures.   She had several EEG studies (last one 2000-2001) that were abnormal.   She was on phenobarbital and dilantin.  She had increases  petit mal seizures with Dilantin and Tegretol She still wonders if she has petit mal seizures.           12/01/2022    9:14 AM 02/03/2022    1:14 PM 04/07/2021   10:23 AM  Montreal Cognitive Assessment   Visuospatial/ Executive (0/5) 5 5 5   Naming (0/3) 3 3 3   Attention: Read list of digits (0/2) 2 2 2   Attention: Read list of letters (0/1) 1 1 1   Attention: Serial 7 subtraction starting at 100 (0/3) 3 3 3   Language: Repeat phrase (0/2) 2 2 0  Language : Fluency (0/1) 1 1 1   Abstraction (0/2) 2 2 2   Delayed Recall (0/5) 4 4 4   Orientation (0/6) 6 5 6   Total 29 28 27    TSH ws normal 10/02/2019.   B12 was normal 12/2020.    She is on metformin  for NIDDM Type 2 (last HgbA1c was 5.6 03/24/2021 but was 6.5)  REVIEW OF SYSTEMS: Constitutional: No fevers, chills, sweats, or change in appetite Eyes: No visual changes, double vision, eye pain Ear, nose and throat: No hearing loss, ear pain, nasal congestion, sore throat Cardiovascular: No chest pain, palpitations Respiratory:  No shortness of breath at rest or with exertion.   No wheezes GastrointestinaI: No nausea, vomiting, diarrhea, abdominal pain, fecal incontinence Genitourinary:  No dysuria, urinary retention or frequency.  No nocturia. Musculoskeletal:  No neck pain, back pain Integumentary: No rash, pruritus, skin lesions Neurological: as above Psychiatric: No depression at this time.  No anxiety Endocrine: No palpitations, diaphoresis, change in appetite, change in weigh or increased thirst Hematologic/Lymphatic:  No anemia, purpura, petechiae. Allergic/Immunologic: No itchy/runny eyes, nasal congestion, recent allergic reactions, rashes  ALLERGIES: Allergies  Allergen Reactions   Sulfa Antibiotics     Bactrim  Renal failure    Liquid Bandage [New Skin] Rash    dermabond allergy     HOME MEDICATIONS:  Current Outpatient Medications:    busPIRone (BUSPAR) 15 MG tablet, Take 1 tablet (15 mg total) by mouth 2 (two) times  daily., Disp: 60 tablet, Rfl: 5   carvedilol (COREG) 12.5 MG tablet, Take 6.25 mg by mouth 2 (two) times daily., Disp: , Rfl:    guanFACINE (INTUNIV) 1 MG TB24 ER tablet, Take one or two po qHS, Disp: 60 tablet, Rfl: 5   hydrochlorothiazide (HYDRODIURIL) 25 MG tablet, Take 25 mg  by mouth daily., Disp: , Rfl:    metFORMIN  (GLUCOPHAGE ) 500 MG tablet, Take 500 mg by mouth daily., Disp: , Rfl:    methocarbamol  (ROBAXIN ) 750 MG tablet, Take 750 mg by mouth daily as needed., Disp: , Rfl:    rosuvastatin  (CRESTOR ) 20 MG tablet, Take 20 mg by mouth daily., Disp: , Rfl:    telmisartan (MICARDIS) 80 MG tablet, Take 80 mg by mouth daily., Disp: , Rfl:    traZODone  (DESYREL ) 50 MG tablet, Take 50 mg by mouth at bedtime., Disp: , Rfl:    Ubrogepant  (UBRELVY ) 100 MG TABS, One po every day prn migrane, Disp: 36 tablet, Rfl: 1  PAST MEDICAL HISTORY: Past Medical History:  Diagnosis Date   ADD (attention deficit disorder)    Cancer (HCC)    Family history of breast cancer    Family history of cervical cancer    Family history of lung cancer    Hypertension    Mixed hyperlipidemia    Pre-diabetes     PAST SURGICAL HISTORY: Past Surgical History:  Procedure Laterality Date   APPLICATION OF A-CELL OF CHEST/ABDOMEN Right 05/19/2020   Procedure: ACELLULAR DERMIS TO RIGHT CHEST;  Surgeon: Arelia Filippo, MD;  Location: [facility];  Service: Plastics;  Laterality: Right;   BREAST IMPLANT EXCHANGE Right 05/19/2020   Procedure: REVISION RIGHT BREAST RECONSTRUCTION WITH SILICONE IMPLANT EXCHANGE;  Surgeon: Arelia Filippo, MD;  Location: [facility];  Service: Plastics;  Laterality: Right;   BREAST LUMPECTOMY WITH RADIOACTIVE SEED LOCALIZATION Right 06/20/2019   Procedure: RIGHT BREAST LUMPECTOMY x2 WITH RADIOACTIVE SEED LOCALIZATION;  Surgeon: Vanderbilt Ned, MD;  Location: [facility];  Service: General;  Laterality: Right;   BREAST RECONSTRUCTION WITH PLACEMENT  OF TISSUE EXPANDER AND ALLODERM Bilateral 11/27/2019   Procedure: BILATERAL BREAST RECONSTRUCTION WITH PLACEMENT OF TISSUE EXPANDER AND ALLODERM;  Surgeon: Arelia Filippo, MD;  Location: [facility];  Service: Plastics;  Laterality: Bilateral;   BREAST RECONSTRUCTION WITH PLACEMENT OF TISSUE EXPANDER AND ALLODERM Right 01/29/2020   Procedure: RIGHT BREAST RECONSTRUCTION WITH PLACEMENT OF TISSUE EXPANDER AND ALLODERM;  Surgeon: Arelia Filippo, MD;  Location: [facility];  Service: Plastics;  Laterality: Right;   BREAST SURGERY     ENDOMETRIAL ABLATION     HAND SURGERY     LIPOSUCTION WITH LIPOFILLING Bilateral 05/19/2020   Procedure: LIPOFILLING FROM ABDOMEN TO BILATERAL CHEST;  Surgeon: Arelia Filippo, MD;  Location: [facility];  Service: Plastics;  Laterality: Bilateral;   REMOVAL OF TISSUE EXPANDER AND PLACEMENT OF IMPLANT Bilateral 02/22/2020   Procedure: REMOVAL OF BILATERAL TISSUE EXPANDERS AND PLACEMENT OF SILICONE IMPLANTS;  Surgeon: Arelia Filippo, MD;  Location: [facility];  Service: Plastics;  Laterality: Bilateral;   SIMPLE MASTECTOMY WITH AXILLARY SENTINEL NODE BIOPSY Bilateral 11/27/2019   Procedure: BILATERAL SIMPLE MASTECTOMY;  Surgeon: Vanderbilt Ned, MD;  Location: [facility];  Service: General;  Laterality: Bilateral;   TISSUE EXPANDER PLACEMENT Bilateral 12/14/2019   Procedure: REMOVAL RIGHT CHEST TISSUE EXPANDER, TISSUE EXPANSION LEFT CHEST;  Surgeon: Arelia Filippo, MD;  Location: [facility];  Service: Plastics;  Laterality: Bilateral;   TUMOR REMOVAL      FAMILY HISTORY: Family History  Problem Relation Age of Onset   Lupus Mother    Hypertension Father    High Cholesterol Father    Autoimmune disease Brother    Breast cancer Maternal Aunt        dx early 26s  COPD Maternal Grandmother    Dementia Maternal Grandmother    Heart failure Maternal Grandmother    Leukemia  Maternal Grandfather    Lung cancer Paternal Grandmother    Cervical cancer Cousin    Lung cancer Cousin    Breast cancer Cousin        dx 66s   Breast cancer Maternal Great-grandmother    Breast cancer Other    Seizures Neg Hx     SOCIAL HISTORY:  Social History   Socioeconomic History   Marital status: Single    Spouse name: Not on file   Number of children: Not on file   Years of education: Not on file   Highest education level: Master's degree (e.g., MA, MS, MEng, MEd, MSW, MBA)  Occupational History   Not on file  Tobacco Use   Smoking status: Former    Current packs/day: 0.00    Types: Cigarettes    Quit date: 2017    Years since quitting: 8.8   Smokeless tobacco: Never  Vaping Use   Vaping status: Never Used  Substance and Sexual Activity   Alcohol use: Yes    Comment: occas   Drug use: Never   Sexual activity: Not on file  Other Topics Concern   Not on file  Social History Narrative   Not on file   Social Drivers of Health   Financial Resource Strain: Low Risk  (01/01/2024)   Received from [facility]   Overall Financial Resource Strain (CARDIA)    Difficulty of Paying Living Expenses: Not hard at all  Food Insecurity: No Food Insecurity (01/01/2024)   [facility]   Hunger Vital Sign    Within the past 12 months, you worried that your food would run out before you got the money to buy more.: Never true    Within the past 12 months, the food you bought just didn't last and you didn't have money to get more.: Never true  Transportation Needs: No Transportation Needs (01/01/2024)   Received from [facility] - Transportation    Lack of Transportation (Medical): No    Lack of Transportation (Non-Medical): No  Physical Activity: Insufficiently Active (01/01/2024)   [facility] from [facility]   Exercise Vital Sign    On average, how many days per week do you engage in moderate to strenuous exercise (like a brisk walk)?: 3 days     On average, how many minutes do you engage in exercise at this level?: 30 min  Stress: No Stress Concern Present (01/01/2024)   Received from [facility] - Occupational Stress Questionnaire    Feeling of Stress : Only a little  Social Connections: Socially Integrated (01/01/2024)   Received from [facility]   Social Network    How would you rate your social network (family, work, friends)?: Good participation with social networks  Intimate Partner Violence: Not At Risk (01/01/2024)   Received from [facility]   HITS    Over the last 12 months how often did your partner physically hurt you?: Never    Over the last 12 months how often did your partner insult you or talk down to you?: Never    Over the last 12 months how often did your partner threaten you with physical harm?: Never    Over the last 12 months how often did your partner scream or curse at you?: Never     PHYSICAL EXAM  Vitals:   06/06/24 0945  BP: (!) 136/95  Pulse: 86  SpO2: 95%  Weight: 203 lb (92.1 kg)  Height: 5' 4 (1.626 m)    Body mass index is 34.84 kg/m.     General: The patient is well-developed and well-nourished and in no acute distress  HEENT/Neck:  Head is [place]/AT.  Sclera are anicteric.  There is a mildly reduced range of motion of the neck.  She has mild tenderness at the right occiput.    Skin/Ext: Extremities are without rash or edema.  Neurologic Exam  Mental status: The patient is alert and oriented x 3 at the time of the examination. The patient has apparent normal recent and remote memory, with an apparently normal attention span and concentration ability.   Speech is normal.     Cranial nerves: Extraocular movements are full.  Facial strength and sensation was normal.  No obvious hearing deficits are noted.  Motor:  Muscle bulk is normal.   Tone is normal. Strength is  5 / 5 in all 4 extremities.   Sensory: Sensory testing is intact to  pinprick, soft touch and vibration sensation in all 4 extremities.     Coordination: Cerebellar testing reveals good finger-nose-finger and heel-to-shin bilaterally.  Gait and station: Station is normal.  Her gait and tandem gait are normal.  No Romberg sign.   Reflexes: Deep tendon reflexes are symmetric and normal bilaterally.      DIAGNOSTIC DATA (LABS, IMAGING, TESTING) - I reviewed patient records, labs, notes, testing and imaging myself where available.  Lab Results  Component Value Date   WBC 9.5 11/22/2019   HGB 12.1 11/22/2019   HCT 38.5 11/22/2019   MCV 93.4 11/22/2019   PLT 310 11/22/2019      Component Value Date/Time   NA 135 02/21/2020 1200   K 3.7 02/21/2020 1200   CL 100 02/21/2020 1200   CO2 24 02/21/2020 1200   GLUCOSE 138 (H) 02/21/2020 1200   BUN 15 02/21/2020 1200   CREATININE 0.84 02/21/2020 1200   CALCIUM  9.6 02/21/2020 1200   PROT 6.7 11/22/2019 0928   ALBUMIN 4.0 11/22/2019 0928   AST 17 11/22/2019 0928   ALT 20 11/22/2019 0928   ALKPHOS 26 (L) 11/22/2019 0928   BILITOT 0.4 11/22/2019 0928   GFRNONAA >60 02/21/2020 1200   GFRAA >60 02/21/2020 1200       ASSESSMENT AND PLAN  Migraine without aura and without status migrainosus, not intractable  Cognitive complaints  Episodic migraine  OSA (obstructive sleep apnea)  Attention deficit disorder (ADD) in adult  Memory loss  Transient alteration of awareness   1.   Due to HTN will d/c  Adderall and start guanfacine for ADD.   Buspar for anxiety.  2.   She had mild OSA, She prefers not to use CPAP.  We discussed weight loss as 30+ pound weight loss would likely get her to minimal/negligible OSA.    She has lost 8 pounds 3.  Ubrelvy  100 mg renew (works better than sumatriptan )_.  Consider an anti-CGRP injection if worsens 4.  Cognition has been stable.  A neurodegenerative process is unlikely.   Return in 6-12  months or sooner if there are new or worsening neurologic symptoms.      Josip Merolla A. Vear, MD, [facility] 06/06/2024, 10:18 AM Certified in Neurology, Clinical Neurophysiology, Sleep Medicine and Neuroimaging  [facility] [address], [address] [place], [place] [address] [phone]

## 2024-06-07 ENCOUNTER — Other Ambulatory Visit (HOSPITAL_COMMUNITY): Payer: Self-pay

## 2024-06-07 ENCOUNTER — Telehealth: Payer: Self-pay

## 2024-06-07 NOTE — Telephone Encounter (Signed)
 Pharmacy Patient Advocate Encounter  Received notification from [employer] that Prior Authorization for Ubrelvy  100mg  Tablets has been APPROVED from 06/07/2024 to 06/07/2025. Unable to obtain price due to refill too soon rejection, last fill date 04/09/2024 next available fill date12/11/2023   PA #/Case ID/Reference #: [id number]  [id number]

## 2024-07-11 ENCOUNTER — Ambulatory Visit: Admitting: Neurology

## 2024-07-15 ENCOUNTER — Other Ambulatory Visit: Payer: Self-pay | Admitting: Neurology

## 2024-07-15 DIAGNOSIS — G43009 Migraine without aura, not intractable, without status migrainosus: Secondary | ICD-10-CM

## 2024-07-17 NOTE — Telephone Encounter (Signed)
 Last OV 06/06/24 Next OV 06/06/25  Last refill 01/04/24 Qty #36/1

## 2024-07-25 ENCOUNTER — Ambulatory Visit: Admitting: Neurology

## 2025-06-06 ENCOUNTER — Ambulatory Visit: Admitting: Neurology
